# Patient Record
Sex: Female | Born: 1945
Health system: Southern US, Community
[De-identification: ages and names within clinical notes are randomized; demographics above are authoritative.]

## PROBLEM LIST (undated history)

## (undated) DIAGNOSIS — R05 Cough: Secondary | ICD-10-CM

## (undated) DIAGNOSIS — I10 Essential (primary) hypertension: Secondary | ICD-10-CM

## (undated) DIAGNOSIS — T7840XA Allergy, unspecified, initial encounter: Secondary | ICD-10-CM

## (undated) DIAGNOSIS — I214 Non-ST elevation (NSTEMI) myocardial infarction: Secondary | ICD-10-CM

## (undated) DIAGNOSIS — K259 Gastric ulcer, unspecified as acute or chronic, without hemorrhage or perforation: Secondary | ICD-10-CM

## (undated) DIAGNOSIS — Z720 Tobacco use: Secondary | ICD-10-CM

## (undated) DIAGNOSIS — G8929 Other chronic pain: Secondary | ICD-10-CM

## (undated) DIAGNOSIS — R058 Other specified cough: Secondary | ICD-10-CM

## (undated) HISTORY — PX: TUBAL LIGATION: SHX77

## (undated) HISTORY — PX: NASAL SEPTUM SURGERY: SHX37

## (undated) HISTORY — PX: ABDOMINAL HYSTERECTOMY: SHX81

## (undated) HISTORY — DX: Allergy, unspecified, initial encounter: T78.40XA

## (undated) HISTORY — PX: ANKLE FRACTURE SURGERY: SHX122

## (undated) HISTORY — PX: TONSILLECTOMY: SUR1361

---

## 2002-05-17 ENCOUNTER — Ambulatory Visit (HOSPITAL_BASED_OUTPATIENT_CLINIC_OR_DEPARTMENT_OTHER): Admission: RE | Admit: 2002-05-17 | Discharge: 2002-05-17 | Payer: Self-pay | Admitting: Orthopedic Surgery

## 2004-08-18 DIAGNOSIS — K259 Gastric ulcer, unspecified as acute or chronic, without hemorrhage or perforation: Secondary | ICD-10-CM

## 2004-08-18 HISTORY — PX: REPAIR OF PERFORATED ULCER: SHX6065

## 2004-08-18 HISTORY — DX: Gastric ulcer, unspecified as acute or chronic, without hemorrhage or perforation: K25.9

## 2005-04-22 ENCOUNTER — Ambulatory Visit (HOSPITAL_COMMUNITY): Admission: RE | Admit: 2005-04-22 | Discharge: 2005-04-23 | Payer: Self-pay | Admitting: Orthopedic Surgery

## 2006-08-18 HISTORY — PX: BACK SURGERY: SHX140

## 2016-04-22 ENCOUNTER — Ambulatory Visit (INDEPENDENT_AMBULATORY_CARE_PROVIDER_SITE_OTHER): Payer: Medicare HMO

## 2016-04-22 ENCOUNTER — Ambulatory Visit (INDEPENDENT_AMBULATORY_CARE_PROVIDER_SITE_OTHER): Payer: Medicare HMO | Admitting: Family Medicine

## 2016-04-22 VITALS — BP 118/80 | HR 102 | Temp 98.4°F | Resp 16 | Ht 62.75 in | Wt 190.8 lb

## 2016-04-22 DIAGNOSIS — R140 Abdominal distension (gaseous): Secondary | ICD-10-CM | POA: Diagnosis not present

## 2016-04-22 DIAGNOSIS — Z131 Encounter for screening for diabetes mellitus: Secondary | ICD-10-CM

## 2016-04-22 DIAGNOSIS — G8929 Other chronic pain: Secondary | ICD-10-CM

## 2016-04-22 DIAGNOSIS — Z23 Encounter for immunization: Secondary | ICD-10-CM

## 2016-04-22 DIAGNOSIS — D72829 Elevated white blood cell count, unspecified: Secondary | ICD-10-CM

## 2016-04-22 DIAGNOSIS — Z1321 Encounter for screening for nutritional disorder: Secondary | ICD-10-CM | POA: Diagnosis not present

## 2016-04-22 DIAGNOSIS — R1013 Epigastric pain: Secondary | ICD-10-CM

## 2016-04-22 DIAGNOSIS — Z1329 Encounter for screening for other suspected endocrine disorder: Secondary | ICD-10-CM | POA: Diagnosis not present

## 2016-04-22 DIAGNOSIS — I10 Essential (primary) hypertension: Secondary | ICD-10-CM | POA: Diagnosis not present

## 2016-04-22 DIAGNOSIS — G4700 Insomnia, unspecified: Secondary | ICD-10-CM

## 2016-04-22 LAB — CBC WITH DIFFERENTIAL/PLATELET
BASOS ABS: 0 {cells}/uL (ref 0–200)
Basophils Relative: 0 %
EOS ABS: 124 {cells}/uL (ref 15–500)
Eosinophils Relative: 1 %
HEMATOCRIT: 43.8 % (ref 35.0–45.0)
Hemoglobin: 14.7 g/dL (ref 11.7–15.5)
Lymphocytes Relative: 29 %
Lymphs Abs: 3596 cells/uL (ref 850–3900)
MCH: 28.1 pg (ref 27.0–33.0)
MCHC: 33.6 g/dL (ref 32.0–36.0)
MCV: 83.7 fL (ref 80.0–100.0)
MONOS PCT: 7 %
MPV: 11 fL (ref 7.5–12.5)
Monocytes Absolute: 868 cells/uL (ref 200–950)
NEUTROS ABS: 7812 {cells}/uL — AB (ref 1500–7800)
NEUTROS PCT: 63 %
Platelets: 337 10*3/uL (ref 140–400)
RBC: 5.23 MIL/uL — ABNORMAL HIGH (ref 3.80–5.10)
RDW: 15.9 % — AB (ref 11.0–15.0)
WBC: 12.4 10*3/uL — ABNORMAL HIGH (ref 3.8–10.8)

## 2016-04-22 MED ORDER — PREGABALIN 150 MG PO CAPS: 150.0000 mg | ORAL_CAPSULE | Freq: Two times a day (BID) | ORAL | 0 refills | Status: DC

## 2016-04-22 MED ORDER — LISINOPRIL-HYDROCHLOROTHIAZIDE 10-12.5 MG PO TABS: 2.0000 | ORAL_TABLET | Freq: Every day | ORAL | 3 refills | Status: DC

## 2016-04-22 MED ORDER — ZOLPIDEM TARTRATE 10 MG PO TABS: 10.0000 mg | ORAL_TABLET | Freq: Every evening | ORAL | 1 refills | Status: DC | PRN

## 2016-04-22 MED ORDER — OMEPRAZOLE 40 MG PO CPDR: 40.0000 mg | DELAYED_RELEASE_CAPSULE | Freq: Every day | ORAL | 3 refills | Status: DC

## 2016-04-22 NOTE — Patient Instructions (Addendum)
Gastroenterology will contact you to schedule an appointment for you.  I have increased your omeprazole 40 mg daily for acid reflux.  If pain worsens or does not resolve by Saturday, return to the office for further evaluation and possible further diagnostic imaging.   IF you received an x-ray today, you will receive an invoice from [facility]. Please contact [facility] at [phone] with questions or concerns regarding your invoice.   IF you received labwork today, you will receive an invoice from [employer]. Please contact [facility] at [phone] with questions or concerns regarding your invoice.   Our billing staff will not be able to assist you with questions regarding bills from these companies.  You will be contacted with the lab results as soon as they are available. The fastest way to get your results is to activate your My Chart account. Instructions are located on the last page of this paperwork. If you have not heard from [place] regarding the results in 2 weeks, please contact this office.

## 2016-04-22 NOTE — Progress Notes (Signed)
Patient ID: Bonnie Lawson, female    DOB: 25-May-1946, 70 y.o.   MRN: [id number]  PCP: Molli Barrows, FNP  Chief Complaint  Patient presents with  . Abdominal Pain    x 2 weeks    Subjective:   HPI Presents for evaluation of abdominal pain times 2 weeks.  70 year old female presents with a complaint of abdominal pain 2 weeks. She has a past medical history of multiple abdominal surgeries including a perforated ulcer. She reports that the pain she's been having for the last 2 weeks is similar to that of when she had the perforated ulcer. She also reports an increased sensation of bloating, burping, and gas. She denies fever, nausea, vomiting, or diarrhea.   She reports no dark and tarry stools. Reports her normal number of stools daily which averages about 2-3. She reports to consistency of her stools as being loose but not diarrhea to soft area.  She recently relocated to the area and thinks she has had a colonoscopy within last 10 years but is uncertain. She takes omeprazole 20 mg for acid reflux. She is also requesting medication refills today as she will be going out of town within the next couple weeks and is afraid that she will run out of medications.  . Social History   Social History  . Marital status: Divorced    Spouse name: N/A  . Number of children: N/A  . Years of education: N/A   Occupational History  . Not on file.   Social History Main Topics  . Smoking status: Current Some Day Smoker  . Smokeless tobacco: Current User  . Alcohol use Not on file  . Drug use: Unknown  . Sexual activity: Not on file   Other Topics Concern  . Not on file   Social History Narrative  . No narrative on file    .No family history on file.   Review of Systems  Constitutional: Negative.   Respiratory: Negative.   Cardiovascular: Negative.   Gastrointestinal: Positive for abdominal pain.       See history of present illness   There are no active problems to display  for this patient.    Prior to Admission medications   Medication Sig Start Date End Date Taking? Authorizing Provider  aspirin EC 81 MG tablet Take 81 mg by mouth daily.   Yes Historical Provider, MD  levothyroxine (SYNTHROID, LEVOTHROID) 75 MCG tablet Take 75 mcg by mouth daily before breakfast.   Yes Historical Provider, MD  lisinopril-hydrochlorothiazide (PRINZIDE,ZESTORETIC) 20-25 MG tablet Take 1 tablet by mouth daily.   Yes Historical Provider, MD  omeprazole (PRILOSEC) 20 MG capsule Take 20 mg by mouth daily.   Yes Historical Provider, MD  pregabalin (LYRICA) 150 MG capsule Take 150 mg by mouth 2 (two) times daily.   Yes Historical Provider, MD  venlafaxine (EFFEXOR) 75 MG tablet Take 75 mg by mouth every morning.   Yes Historical Provider, MD  amitriptyline (ELAVIL) 50 MG tablet Take 50 mg by mouth at bedtime.    Historical Provider, MD  HYDROcodone-acetaminophen (NORCO/VICODIN) 5-325 MG tablet Take 1 tablet by mouth every 6 (six) hours as needed for moderate pain.    Historical Provider, MD  zolpidem (AMBIEN) 10 MG tablet Take by mouth at bedtime as needed for sleep.    Historical Provider, MD    Allergies  Allergen Reactions  . Sulfa Antibiotics        Objective:  Physical Exam  Constitutional: She is oriented to  person, place, and time. She appears well-developed and well-nourished.  Abdominal: Soft. Bowel sounds are normal. She exhibits no distension and no mass. There is no tenderness. There is no rebound and no guarding.  Scarring from healed surgical issue at mid lower epigastric region descending down into the lower pelvis area.   Musculoskeletal: Normal range of motion.  Neurological: She is alert and oriented to person, place, and time.  Skin: Skin is warm and dry.     . Vitals:   04/22/16 1526  BP: 118/80  Pulse: (!) 102  Resp: 16  Temp: 98.4 F (36.9 C)   Assessment & Plan:  1. Abdominal pain, chronic, epigastric, unknown etiology.  Patient has a  significant history of abdominal complaints. She reports he spent an extended period of time since she has seen a gastroenterologist.  - DG Abd 2 Views; negative of acute findings. - CBC with Differential/Platelet-Leukocytosis.  - CT Abdomen Pelvis W Contrast-Negative for acute findings  Plan: -Omeprazole 40 mg daily, for bloating and acid reflux symptoms. - Ambulatory referral to Gastroenterology  2. Abdominal bloating - Ambulatory referral to Gastroenterology -Plan: -Omeprazole 40 mg daily, for bloating and acid reflux symptoms.  3. Influenza vaccine administered - Flu Vaccine QUAD 36+ mos IM  4. Screening for diabetes mellitus (DM) - COMPLETE METABOLIC PANEL WITH GFR; Future  5. Screening for thyroid disorder - TSH; Future  6. Encounter for vitamin deficiency screening - VITAMIN D 25 Hydroxy (Vit-D Deficiency, Fractures); Future  7. Leukocytosis - CT Abdomen Pelvis W Contrast-CT of abdomen and pelvis to rule out diverticulitis or bowel perforation. - CBC with Differential/Platelet; repeat to re-evaluate elevated WBC.  8. Insomnia, reports long-term use of zolpidem for sleep. Plan: -Increase daily physical activity during the day to promote sleep. -zolpidem (AMBIEN) 10 MG tablet, as needed, hours of sleep.  9. Essential hypertension, controlled. Plan: Continue lisinopril-hydrochlorothiazide (PRINZIDE,ZESTORETIC) 10-12.5 MG daily.  10. Chronic pain, patient reports a long time use of Lyrica chronic pain.  Agree to refill one time only until she has received a complete physical to evaluate the necessity for continued use of this medication.   Patient will return in 5 days for complete physical exam. Placed orders for future fasting labs.  Carroll Sage. Kenton Kingfisher, MSN, FNP-C [facility]

## 2016-04-23 ENCOUNTER — Telehealth: Payer: Self-pay | Admitting: Emergency Medicine

## 2016-04-23 ENCOUNTER — Ambulatory Visit (HOSPITAL_COMMUNITY)
Admission: RE | Admit: 2016-04-23 | Discharge: 2016-04-23 | Disposition: A | Discharge status: Home / Self Care | Payer: Medicare HMO | Source: Ambulatory Visit | Attending: Family Medicine | Admitting: Family Medicine

## 2016-04-23 DIAGNOSIS — R1013 Epigastric pain: Secondary | ICD-10-CM | POA: Insufficient documentation

## 2016-04-23 DIAGNOSIS — K760 Fatty (change of) liver, not elsewhere classified: Secondary | ICD-10-CM | POA: Diagnosis not present

## 2016-04-23 DIAGNOSIS — G8929 Other chronic pain: Secondary | ICD-10-CM

## 2016-04-23 DIAGNOSIS — D72829 Elevated white blood cell count, unspecified: Secondary | ICD-10-CM | POA: Diagnosis not present

## 2016-04-23 LAB — COMPREHENSIVE METABOLIC PANEL
ALBUMIN: 4.1 g/dL (ref 3.5–5.0)
ALK PHOS: 103 U/L (ref 38–126)
ALT: 16 U/L (ref 14–54)
AST: 21 U/L (ref 15–41)
Anion gap: 11 (ref 5–15)
BUN: 16 mg/dL (ref 6–20)
CHLORIDE: 97 mmol/L — AB (ref 101–111)
CO2: 27 mmol/L (ref 22–32)
CREATININE: 0.91 mg/dL (ref 0.44–1.00)
Calcium: 10.5 mg/dL — ABNORMAL HIGH (ref 8.9–10.3)
GFR calc non Af Amer: >60 mL/min (ref >60)
GLUCOSE: 114 mg/dL — AB (ref 65–99)
Potassium: 4.1 mmol/L (ref 3.5–5.1)
SODIUM: 135 mmol/L (ref 135–145)
Total Bilirubin: 0.7 mg/dL (ref 0.3–1.2)
Total Protein: 7.3 g/dL (ref 6.5–8.1)

## 2016-04-23 MED ORDER — IOPAMIDOL (ISOVUE-300) INJECTION 61%
100.0000 mL | Freq: Once | INTRAVENOUS | Status: AC | PRN
  Administered 2016-04-23: 100 mL via INTRAVENOUS

## 2016-04-23 MED ORDER — IOPAMIDOL (ISOVUE-300) INJECTION 61%
INTRAVENOUS | Status: DC
Start: 2016-04-23 — End: 2016-04-24
  Filled 2016-04-23: qty 100

## 2016-04-23 MED ORDER — BARIUM SULFATE 2.1 % PO SUSP
ORAL | Status: AC
  Filled 2016-04-23: qty 2

## 2016-04-25 ENCOUNTER — Telehealth: Payer: Self-pay | Admitting: Family Medicine

## 2016-04-25 DIAGNOSIS — G8929 Other chronic pain: Secondary | ICD-10-CM | POA: Insufficient documentation

## 2016-04-25 DIAGNOSIS — I10 Essential (primary) hypertension: Secondary | ICD-10-CM | POA: Insufficient documentation

## 2016-04-25 DIAGNOSIS — R1013 Epigastric pain: Principal | ICD-10-CM

## 2016-04-25 DIAGNOSIS — G4700 Insomnia, unspecified: Secondary | ICD-10-CM | POA: Insufficient documentation

## 2016-04-25 DIAGNOSIS — Z23 Encounter for immunization: Secondary | ICD-10-CM | POA: Insufficient documentation

## 2016-04-25 DIAGNOSIS — R140 Abdominal distension (gaseous): Secondary | ICD-10-CM | POA: Insufficient documentation

## 2016-04-25 DIAGNOSIS — Z131 Encounter for screening for diabetes mellitus: Secondary | ICD-10-CM | POA: Insufficient documentation

## 2016-04-25 DIAGNOSIS — D72829 Elevated white blood cell count, unspecified: Secondary | ICD-10-CM | POA: Insufficient documentation

## 2016-04-25 NOTE — Telephone Encounter (Signed)
Spoke with patient on the phone while at [facility] Radiology to advise CT results were normal. Asked to return to the office on Saturday to repeat CBC to reevaluate WBC.   Bonnie Lawson. Kenton Kingfisher, MSN, FNP-C [facility]

## 2016-04-26 ENCOUNTER — Ambulatory Visit (INDEPENDENT_AMBULATORY_CARE_PROVIDER_SITE_OTHER): Payer: Medicare HMO | Admitting: Family Medicine

## 2016-04-26 VITALS — BP 130/78 | HR 92 | Temp 97.3°F | Resp 16 | Ht 63.0 in | Wt 190.2 lb

## 2016-04-26 DIAGNOSIS — G4700 Insomnia, unspecified: Secondary | ICD-10-CM | POA: Diagnosis not present

## 2016-04-26 DIAGNOSIS — G8929 Other chronic pain: Secondary | ICD-10-CM

## 2016-04-26 DIAGNOSIS — Z23 Encounter for immunization: Secondary | ICD-10-CM

## 2016-04-26 DIAGNOSIS — D72829 Elevated white blood cell count, unspecified: Secondary | ICD-10-CM

## 2016-04-26 DIAGNOSIS — Z1321 Encounter for screening for nutritional disorder: Secondary | ICD-10-CM

## 2016-04-26 DIAGNOSIS — F411 Generalized anxiety disorder: Secondary | ICD-10-CM

## 2016-04-26 DIAGNOSIS — G5793 Unspecified mononeuropathy of bilateral lower limbs: Secondary | ICD-10-CM | POA: Diagnosis not present

## 2016-04-26 DIAGNOSIS — Z1329 Encounter for screening for other suspected endocrine disorder: Secondary | ICD-10-CM | POA: Diagnosis not present

## 2016-04-26 DIAGNOSIS — Z131 Encounter for screening for diabetes mellitus: Secondary | ICD-10-CM | POA: Diagnosis not present

## 2016-04-26 DIAGNOSIS — I10 Essential (primary) hypertension: Secondary | ICD-10-CM

## 2016-04-26 LAB — LIPID PANEL
CHOLESTEROL: 203 mg/dL — AB (ref 125–200)
HDL: 56 mg/dL (ref >=46)
LDL Cholesterol: 118 mg/dL (ref <130)
Total CHOL/HDL Ratio: 3.6 Ratio (ref <=5.0)
Triglycerides: 144 mg/dL (ref <150)
VLDL: 29 mg/dL (ref <30)

## 2016-04-26 LAB — CBC WITH DIFFERENTIAL/PLATELET
BASOS PCT: 0 %
Basophils Absolute: 0 cells/uL (ref 0–200)
EOS ABS: 158 {cells}/uL (ref 15–500)
Eosinophils Relative: 2 %
HEMATOCRIT: 43.5 % (ref 35.0–45.0)
HEMOGLOBIN: 14.6 g/dL (ref 11.7–15.5)
LYMPHS PCT: 23 %
Lymphs Abs: 1817 cells/uL (ref 850–3900)
MCH: 28.5 pg (ref 27.0–33.0)
MCHC: 33.6 g/dL (ref 32.0–36.0)
MCV: 84.8 fL (ref 80.0–100.0)
MONO ABS: 632 {cells}/uL (ref 200–950)
MPV: 10.9 fL (ref 7.5–12.5)
Monocytes Relative: 8 %
Neutro Abs: 5293 cells/uL (ref 1500–7800)
Neutrophils Relative %: 67 %
Platelets: 295 10*3/uL (ref 140–400)
RBC: 5.13 MIL/uL — ABNORMAL HIGH (ref 3.80–5.10)
RDW: 15.3 % — AB (ref 11.0–15.0)
WBC: 7.9 10*3/uL (ref 3.8–10.8)

## 2016-04-26 LAB — COMPLETE METABOLIC PANEL WITH GFR
ALBUMIN: 4.3 g/dL (ref 3.6–5.1)
ALK PHOS: 98 U/L (ref 33–130)
ALT: 13 U/L (ref 6–29)
AST: 17 U/L (ref 10–35)
BILIRUBIN TOTAL: 0.5 mg/dL (ref 0.2–1.2)
BUN: 17 mg/dL (ref 7–25)
CO2: 28 mmol/L (ref 20–31)
Calcium: 9.5 mg/dL (ref 8.6–10.4)
Chloride: 101 mmol/L (ref 98–110)
Creat: 0.9 mg/dL (ref 0.60–0.93)
GFR, Est African American: 75 mL/min (ref >=60)
GFR, Est Non African American: 65 mL/min (ref >=60)
GLUCOSE: 101 mg/dL — AB (ref 65–99)
Potassium: 4.3 mmol/L (ref 3.5–5.3)
SODIUM: 134 mmol/L — AB (ref 135–146)
TOTAL PROTEIN: 7.3 g/dL (ref 6.1–8.1)

## 2016-04-26 LAB — TSH: TSH: 2.39 mIU/L

## 2016-04-26 MED ORDER — ZOLPIDEM TARTRATE 10 MG PO TABS: 10.0000 mg | ORAL_TABLET | Freq: Every evening | ORAL | 0 refills | Status: DC | PRN

## 2016-04-26 MED ORDER — CYCLOBENZAPRINE HCL 10 MG PO TABS: 10.0000 mg | ORAL_TABLET | Freq: Three times a day (TID) | ORAL | 0 refills | Status: DC | PRN

## 2016-04-26 MED ORDER — VENLAFAXINE HCL ER 75 MG PO CP24: 75.0000 mg | ORAL_CAPSULE | Freq: Every day | ORAL | 1 refills | Status: DC

## 2016-04-26 MED ORDER — TRAMADOL HCL 50 MG PO TABS: 50.0000 mg | ORAL_TABLET | Freq: Three times a day (TID) | ORAL | 0 refills | Status: DC | PRN

## 2016-04-26 MED ORDER — PREGABALIN 150 MG PO CAPS: 150.0000 mg | ORAL_CAPSULE | Freq: Two times a day (BID) | ORAL | 0 refills | Status: DC

## 2016-04-26 MED ORDER — ZOLPIDEM TARTRATE 10 MG PO TABS
10.0000 mg | ORAL_TABLET | Freq: Every evening | ORAL | 0 refills | Status: DC | PRN
Start: 2016-05-08 — End: 2016-04-26

## 2016-04-26 MED ORDER — CYCLOBENZAPRINE HCL 10 MG PO TABS: 10.0000 mg | ORAL_TABLET | Freq: Three times a day (TID) | ORAL | 2 refills | Status: DC | PRN

## 2016-04-26 NOTE — Patient Instructions (Addendum)
Follow-up for medication refills. Increase activity for weight loss.     Pneumococcal Vaccine, Polyvalent suspension for injection What is this medicine? PNEUMOCOCCAL VACCINE (NEU mo KOK al vak SEEN) is a vaccine used to prevent pneumococcus bacterial infections. These bacteria can cause serious infections like pneumonia, meningitis, and blood infections. This vaccine will lower your chance of getting pneumonia. If you do get pneumonia, it can make your symptoms milder and your illness shorter. This vaccine will not treat an infection and will not cause infection. This vaccine is recommended for infants and young children, adults with certain medical conditions, and adults 11 years or older. This medicine may be used for other purposes; ask your health care provider or pharmacist if you have questions. What should I tell my health care provider before I take this medicine? They need to know if you have any of these conditions: -bleeding problems -fever -immune system problems -an unusual or allergic reaction to pneumococcal vaccine, diphtheria toxoid, other vaccines, latex, other medicines, foods, dyes, or preservatives -pregnant or trying to get pregnant -breast-feeding How should I use this medicine? This vaccine is for injection into a muscle. It is given by a health care professional. A copy of Vaccine Information Statements will be given before each vaccination. Read this sheet carefully each time. The sheet may change frequently. Talk to your pediatrician regarding the use of this medicine in children. While this drug may be prescribed for children as young as 75 weeks old for selected conditions, precautions do apply. Overdosage: If you think you have taken too much of this medicine contact a poison control center or emergency room at once. NOTE: This medicine is only for you. Do not share this medicine with others. What if I miss a dose? It is important not to miss your dose. Call  your doctor or health care professional if you are unable to keep an appointment. What may interact with this medicine? -medicines for cancer chemotherapy -medicines that suppress your immune function -steroid medicines like prednisone or cortisone This list may not describe all possible interactions. Give your health care provider a list of all the medicines, herbs, non-prescription drugs, or dietary supplements you use. Also tell them if you smoke, drink alcohol, or use illegal drugs. Some items may interact with your medicine. What should I watch for while using this medicine? Mild fever and pain should go away in 3 days or less. Report any unusual symptoms to your doctor or health care professional. What side effects may I notice from receiving this medicine? Side effects that you should report to your doctor or health care professional as soon as possible: -allergic reactions like skin rash, itching or hives, swelling of the face, lips, or tongue -breathing problems -confused -fast or irregular heartbeat -fever over 102 degrees F -seizures -unusual bleeding or bruising -unusual muscle weakness Side effects that usually do not require medical attention (report to your doctor or health care professional if they continue or are bothersome): -aches and pains -diarrhea -fever of 102 degrees F or less -headache -irritable -loss of appetite -pain, tender at site where injected -trouble sleeping This list may not describe all possible side effects. Call your doctor for medical advice about side effects. You may report side effects to FDA at 1-800-FDA-1088. Where should I keep my medicine? This does not apply. This vaccine is given in a clinic, pharmacy, doctor's office, or other health care setting and will not be stored at home. NOTE: This sheet is a summary.  It may not cover all possible information. If you have questions about this medicine, talk to your doctor, pharmacist, or health  care provider.    2016, Elsevier/Gold Standard. (2014-05-11 10:27:27)    IF you received an x-ray today, you will receive an invoice from [facility] Rad[facility]. Please contact [facility]iology at [phone] with questions or concerns regarding your invoice.   IF you received labwork today, you will receive an invoice from [employer]. Please contact [facility] at [phone] with questions or concerns regarding your invoice.   Our billing staff will not be able to assist you with questions regarding bills from these companies.  You will be contacted with the lab results as soon as they are available. The fastest way to get your results is to activate your My Chart account. Instructions are located on the last page of this paperwork. If you have not heard from [place] regarding the results in 2 weeks, please contact this office.     h Exercising to Stay Healthy Exercising regularly is important. It has many health benefits, such as:  Improving your overall fitness, flexibility, and endurance.  Increasing your bone density.  Helping with weight control.  Decreasing your body fat.  Increasing your muscle strength.  Reducing stress and tension.  Improving your overall health. In order to become healthy and stay healthy, it is recommended that you do moderate-intensity and vigorous-intensity exercise. You can tell that you are exercising at a moderate intensity if you have a higher heart rate and faster breathing, but you are still able to hold a conversation. You can tell that you are exercising at a vigorous intensity if you are breathing much harder and faster and cannot hold a conversation while exercising. HOW OFTEN SHOULD I EXERCISE? Choose an activity that you enjoy and set realistic goals. Your health care provider can help you to make an activity plan that works for you. Exercise regularly as directed by your health care provider. This may include:    Doing resistance training twice each week, such as:  Push-ups.  Sit-ups.  Lifting weights.  Using resistance bands.  Doing a given intensity of exercise for a given amount of time. Choose from these options:  150 minutes of moderate-intensity exercise every week.  75 minutes of vigorous-intensity exercise every week.  A mix of moderate-intensity and vigorous-intensity exercise every week. Children, pregnant women, people who are out of shape, people who are overweight, and older adults may need to consult a health care provider for individual recommendations. If you have any sort of medical condition, be sure to consult your health care provider before starting a new exercise program.  WHAT ARE SOME EXERCISE IDEAS? Some moderate-intensity exercise ideas include:   Walking at a rate of 1 mile in 15 minutes.  Biking.  Hiking.  Golfing.  Dancing. Some vigorous-intensity exercise ideas include:   Walking at a rate of at least 4.5 miles per hour.  Jogging or running at a rate of 5 miles per hour.  Biking at a rate of at least 10 miles per hour.  Lap swimming.  Roller-skating or in-line skating.  Cross-country skiing.  Vigorous competitive sports, such as football, basketball, and soccer.  Jumping rope.  Aerobic dancing. WHAT ARE SOME EVERYDAY ACTIVITIES THAT CAN HELP ME TO GET EXERCISE?  Yard work, such as:  Psychologist, educational.  Raking and bagging leaves.  Washing and waxing your car.  Pushing a stroller.  Shoveling snow.  Gardening.  Washing windows  or floors. HOW CAN I BE MORE ACTIVE IN MY DAY-TO-DAY ACTIVITIES?  Use the stairs instead of the elevator.  Take a walk during your lunch break.  If you drive, park your car farther away from work or school.  If you take public transportation, get off one stop early and walk the rest of the way.  Make all of your phone calls while standing up and walking around.  Get up, stretch, and walk  around every 30 minutes throughout the day. WHAT GUIDELINES SHOULD I FOLLOW WHILE EXERCISING?  Do not exercise so much that you hurt yourself, feel dizzy, or get very short of breath.  Consult your health care provider before starting a new exercise program.  Wear comfortable clothes and shoes with good support.  Drink plenty of water while you exercise to prevent dehydration or heat stroke. Body water is lost during exercise and must be replaced.  Work out until you breathe faster and your heart beats faster.   This information is not intended to replace advice given to you by your health care provider. Make sure you discuss any questions you have with your health care provider.   Document Released: 09/06/2010 Document Revised: 08/25/2014 Document Reviewed: 01/05/2014 Elsevier Interactive Patient Education [employer].

## 2016-04-26 NOTE — Progress Notes (Signed)
Patient ID: Bonnie Lawson, female    DOB: [birth year], 70 y.o.   MRN: [id number]  PCP: Molli Barrows, FNP  Chief Complaint  Patient presents with  . Follow-up    labs    Subjective:   HPI 70 year old female presents for a medication refill and to establish care.   She was previously followed at [facility] in [facility] more than 1 year ago.  Wished to establish care here at [facility].  She was seen earlier this week for a 2 week history of abdominal pain , which she now reports has resolved after being prescribed a higher dose of omeprazole.   In review of her history, she reports a bilateral foot neuropathy, worst in left foot following a broken ankle in which she is treated with Lyrica and amitriptyline for the chronic pain. Denies any recent falls, or numbness or tingling extending to her fingers.   She requests screening for diabetes as her mother (living) is insulin dependent, sister and her daughter are both diabetics. She reports that it has been awhile since she has had her thyroid level and cholesterol checked. Patient reports chronically taking Zolpidem for sleep as she has struggled with insomnia for several years.  She reports having back surgery in 2006 which has contributed to her chronic pain.  She is currently taking hydrocodone-acetaminophen for pain management with some relief. She is taking effexor for generalized anxiety disorder.  Denies any recent panic attacks or agitation. Reports good relief and control of symptoms with current medication.   Patient reports having her colonoscopy within the last 10 years, is due for her mammogram, and request the Prevnar 13 already received Pneumovax 23.  . Social History   Social History  . Marital status: Divorced    Spouse name: N/A  . Number of children: N/A  . Years of education: N/A   Occupational History  . Not on file.   Social History Main Topics  . Smoking status: Current Some Day Smoker  . Smokeless  tobacco: Current User  . Alcohol use Not on file  . Drug use: Unknown  . Sexual activity: Not on file   Other Topics Concern  . Not on file   Social History Narrative  . No narrative on file  .No family history on file.    Review of Systems SEE HPI     . Depression screen [facility] 2/9 04/26/2016 04/22/2016  Decreased Interest 0 0  Down, Depressed, Hopeless 0 1  PHQ - 2 Score 0 1    Patient Active Problem List   Diagnosis Date Noted  . Abdominal pain, chronic, epigastric 04/25/2016  . Abdominal bloating 04/25/2016  . Leukocytosis 04/25/2016  . Insomnia 04/25/2016  . Essential hypertension 04/25/2016  . Chronic pain 04/25/2016     Prior to Admission medications   Medication Sig Start Date End Date Taking? Authorizing Provider  amitriptyline (ELAVIL) 50 MG tablet Take 50 mg by mouth at bedtime.   Yes Historical Provider, MD  aspirin EC 81 MG tablet Take 81 mg by mouth daily.   Yes Historical Provider, MD  HYDROcodone-acetaminophen (NORCO/VICODIN) 5-325 MG tablet Take 1 tablet by mouth every 6 (six) hours as needed for moderate pain.   Yes Historical Provider, MD  levothyroxine (SYNTHROID, LEVOTHROID) 75 MCG tablet Take 75 mcg by mouth daily before breakfast.   Yes Historical Provider, MD  lisinopril-hydrochlorothiazide (PRINZIDE,ZESTORETIC) 10-12.5 MG tablet Take 2 tablets by mouth daily. 04/22/16  Yes Sedalia Muta, FNP  omeprazole (  PRILOSEC) 40 MG capsule Take 1 capsule (40 mg total) by mouth daily. 04/22/16  Yes Sedalia Muta, FNP  pregabalin (LYRICA) 150 MG capsule Take 1 capsule (150 mg total) by mouth 2 (two) times daily. 04/22/16  Yes Sedalia Muta, FNP  venlafaxine (EFFEXOR) 75 MG tablet Take 75 mg by mouth every morning.   Yes Historical Provider, MD  zolpidem (AMBIEN) 10 MG tablet Take 1 tablet (10 mg total) by mouth at bedtime as needed for sleep. 04/22/16 05/22/16 Yes Sedalia Muta, FNP     Allergies  Allergen Reactions  .  Sulfa Antibiotics        Objective:  Physical Exam  Constitutional: She appears well-developed and well-nourished.  HENT:  Head: Normocephalic and atraumatic.  Right Ear: External ear normal.  Left Ear: External ear normal.  Nose: Nose normal.  Mouth/Throat: Oropharynx is clear and moist.  Eyes: Conjunctivae and EOM are normal. Pupils are equal, round, and reactive to light.  Neck: Normal range of motion. Neck supple. No thyromegaly present.  Cardiovascular: Normal rate, regular rhythm, normal heart sounds and intact distal pulses.   Pulmonary/Chest: Effort normal and breath sounds normal.  Abdominal: Soft. Bowel sounds are normal. She exhibits no distension and no mass. There is no tenderness. There is no rebound and no guarding.  Musculoskeletal: Normal range of motion.  Neurological: She is alert. She has normal reflexes.  Skin: Skin is warm and dry.  Seborrheic keratosis upper extremities, neck, and face.  Psychiatric: She has a normal mood and affect. Her behavior is normal. Judgment and thought content normal.   . Vitals:   04/26/16 0928  BP: 130/78  Pulse: (!) 113  Resp: 16  Temp: 97.3 F (36.3 C)   Assessment & Plan:  1. Essential hypertension, Controlled  Plan: - EKG 12-Lead, to obtain a baseline rhythm for patient EMR Continue Lisinopril-hydrochlorothiazide 10-12.5 mg tablet   2. Leukocytosis - CBC with Differential/Platelet -Resolved.  3. Neuropathy of both feet ([place]), Stable  Plan: Continue pregabalin (LYRICA) 150 MG capsule, twice daily.       Sig: Take 1 tablet (50 mg total) by mouth every 8 (eight) hours as needed.    Dispense:  30 tablet    Refill:  0    Order Specific Question:   Supervising Provider    Answer:   SHAW, EVA N [[id number]]  . cyclobenzaprine (FLEXERIL) 10 MG tablet    Sig: Take 1 tablet (10 mg total) by mouth 3 (three) times daily as needed for muscle spasms.    Dispense:  30 tablet    Refill:  2    Order Specific Question:    Supervising Provider    Answer:   SHAW, EVA N [[id number]]    4. Generalized anxiety disorder, controlled/stable. Plan: Continue venlafaxine XR (EFFEXOR XR) 75 MG 24 hr capsule, once daily  5. Insomnia, controlled. Plan: Continue-zolpidem (AMBIEN) 10 MG tablet, as needed for sleep.  6. Chronic pain, uncontrolled with current pain medication.  Patient is already chronically prescribed controlled medications for neuropathy and insomnia and she reported minimal relief of pain with hydrocodone-acetaminophen. Plan:  Discontinue patients hydrocodone-acetaminophen and replace with  Tramadol 50 mg every 8 hours as needed for back pain. Cyclobenzaprine 10 mg up to 3 times daily as needed for back muscle spasms.  7. Screening for diabetes mellitus (DM), current lab results were mostly unremarkable. Plan: - Lipid panel-increase physical activity. Reduce intake for food high in saturated fat. Weight loss. - COMPLETE METABOLIC PANEL  WITH GFR-no evidence of diabetes.   Recheck in 6-12  months.  8. Hypothyroidism, stable Plan: - TSH-normal  Continue levothyroxine 75 mcg daily.  Recheck TSH in 12 months  9. Encounter for vitamin deficiency screening Plan: - VITAMIN D 25 Hydroxy (Vit-D Deficiency, Fractures)-normal  -Reheck at next wellness visit in 12 months.   Patient signed and initialed a controlled substance agreement form. Sent to be scanned int EMR. Follow-up in 3 months for medication refills.  Carroll Sage. Kenton Kingfisher, MSN, FNP-C [facility]

## 2016-04-28 LAB — VITAMIN D 25 HYDROXY (VIT D DEFICIENCY, FRACTURES): Vit D, 25-Hydroxy: 63 ng/mL (ref 30–100)

## 2016-04-28 MED ORDER — LEVOTHYROXINE SODIUM 75 MCG PO TABS: 75.0000 ug | ORAL_TABLET | Freq: Every day | ORAL | 3 refills | Status: DC

## 2016-04-30 ENCOUNTER — Encounter: Payer: Self-pay | Admitting: Family Medicine

## 2016-04-30 NOTE — Progress Notes (Signed)
Dear Ms. [name],  Below are the results from your recent visit as were as expected:  Resulted Orders  Lipid panel  Result Value Ref Range   Cholesterol 203 (H) 125 - 200 mg/dL   Triglycerides 144 <150 mg/dL   HDL 56 >=46 mg/dL   Total CHOL/HDL Ratio 3.6 <=5.0 Ratio   VLDL 29 <30 mg/dL   LDL Cholesterol 118 <130 mg/dL     Comment:       Total Cholesterol/HDL Ratio:CHD Risk                        Coronary Heart Disease Risk Table                                        Men       Women          1/2 Average Risk              3.4        3.3              Average Risk              5.0        4.4           2X Average Risk              9.6        7.1           3X Average Risk             23.4       11.0 Use the calculated Patient Ratio above and the CHD Risk table  to determine the patient's CHD Risk.    Narrative   Performed at:  [facility]                [address]                [place], [place] [address]  COMPLETE METABOLIC PANEL WITH GFR  Result Value Ref Range   Sodium 134 (L) 135 - 146 mmol/L   Potassium 4.3 3.5 - 5.3 mmol/L   Chloride 101 98 - 110 mmol/L   CO2 28 20 - 31 mmol/L   Glucose, Bld 101 (H) 65 - 99 mg/dL   BUN 17 7 - 25 mg/dL   Creat 0.90 0.60 - 0.93 mg/dL     Comment:       For patients > or = 70 years of age: The upper reference limit for Creatinine is approximately 13% higher for people identified as African-American.      Total Bilirubin 0.5 0.2 - 1.2 mg/dL   Alkaline Phosphatase 98 33 - 130 U/L   AST 17 10 - 35 U/L   ALT 13 6 - 29 U/L   Total Protein 7.3 6.1 - 8.1 g/dL   Albumin 4.3 3.6 - 5.1 g/dL   Calcium 9.5 8.6 - 10.4 mg/dL   GFR, Est African American 75 >=60 mL/min   GFR, Est Non African American 65 >=60 mL/min   Narrative   Performed at:  [address], [address]                [place], [place]  [address]  TSH  Result Value Ref Range   TSH 2.39 mIU/L     Comment:       Reference  Range   > or = 20 Years  0.40-4.50   Pregnancy Range First trimester  0.26-2.66 Second trimester 0.55-2.73 Third trimester  0.43-2.91      Narrative   Performed at:  [address], [address]                [place], [place] [address]  VITAMIN D 25 Hydroxy (Vit-D Deficiency, Fractures)  Result Value Ref Range   Vit D, 25-Hydroxy 63 30 - 100 ng/mL     Comment:     Vitamin D Status           25-OH Vitamin D        Deficiency                <20 ng/mL        Insufficiency         20 - 29 ng/mL        Optimal             > or = 30 ng/mL   For 25-OH Vitamin D testing on patients on D2-supplementation and patients for whom quantitation of D2 and D3 fractions is required, the QuestAssureD 25-OH VIT D, (D2,D3), LC/MS/MS is recommended: order code [id number] (patients > 2 yrs).    Narrative   Performed at:  [address], [address]                [place], [place] [address]  CBC with Differential/Platelet  Result Value Ref Range   WBC 7.9 3.8 - 10.8 K/uL   RBC 5.13 (H) 3.80 - 5.10 MIL/uL   Hemoglobin 14.6 11.7 - 15.5 g/dL   HCT 43.5 35.0 - 45.0 %   MCV 84.8 80.0 - 100.0 fL   MCH 28.5 27.0 - 33.0 pg   MCHC 33.6 32.0 - 36.0 g/dL   RDW 15.3 (H) 11.0 - 15.0 %   Platelets 295 140 - 400 K/uL   MPV 10.9 7.5 - 12.5 fL   Neutro Abs 5,293 1,500 - 7,800 cells/uL   Lymphs Abs 1,817 850 - 3,900 cells/uL   Monocytes Absolute 632 200 - 950 cells/uL   Eosinophils Absolute 158 15 - 500 cells/uL   Basophils Absolute 0 0 - 200 cells/uL   Neutrophils Relative % 67 %   Lymphocytes Relative 23 %   Monocytes Relative 8 %   Eosinophils Relative 2 %   Basophils Relative 0 %   Smear Review Criteria for review not met    Narrative   Performed at:  [address], [address]                [place], [place] [address]     If you have any questions or concerns, please don't hesitate to  call.  Sincerely,   Molli Barrows, FNP

## 2016-05-01 ENCOUNTER — Encounter: Payer: Self-pay | Admitting: Physician Assistant

## 2016-05-14 ENCOUNTER — Ambulatory Visit: Payer: Medicare HMO | Admitting: Physician Assistant

## 2016-06-27 ENCOUNTER — Ambulatory Visit (INDEPENDENT_AMBULATORY_CARE_PROVIDER_SITE_OTHER): Payer: Medicare HMO | Admitting: Family Medicine

## 2016-06-27 VITALS — BP 112/74 | HR 103 | Temp 98.3°F | Resp 18 | Ht 63.0 in | Wt 192.0 lb

## 2016-06-27 DIAGNOSIS — M545 Low back pain, unspecified: Secondary | ICD-10-CM

## 2016-06-27 DIAGNOSIS — G8929 Other chronic pain: Secondary | ICD-10-CM

## 2016-06-27 MED ORDER — PREDNISONE 20 MG PO TABS: 40.0000 mg | ORAL_TABLET | Freq: Every day | ORAL | 0 refills | Status: DC

## 2016-06-27 NOTE — Progress Notes (Signed)
Patient ID: Bonnie Lawson, female    DOB: Jun 16, 1946, 70 y.o.   MRN: [id number]  PCP: Molli Barrows, FNP  Chief Complaint  Patient presents with  . Back Pain    old pulled muscle injury    Subjective:   HPI 70 year old female presents for evaluation of an old muscle injury. Started about one month ago while she was visiting family out of state. Continuous pain for a few days and now more of a persistent aching.  At one point she reports pain was so intense she was struggling to stand. She hasn't used heat or cold application to back. Never seen a back pain specialist. No new injury this is a chronic condition. Denies any pain radiating into lower leg, pain is more localized to the left side of lower back. Hx of lower lumbar surgery related to a herniated disk 2016.  Social History   Social History  . Marital status: Divorced    Spouse name: N/A  . Number of children: N/A  . Years of education: N/A   Occupational History  . Not on file.   Social History Main Topics  . Smoking status: Current Some Day Smoker  . Smokeless tobacco: Current User  . Alcohol use No  . Drug use: No  . Sexual activity: Not on file   Other Topics Concern  . Not on file   Social History Narrative  . No narrative on file    History reviewed. No pertinent family history. Review of Systems  See HPI   Patient Active Problem List   Diagnosis Date Noted  . Abdominal pain, chronic, epigastric 04/25/2016  . Abdominal bloating 04/25/2016  . Leukocytosis 04/25/2016  . Insomnia 04/25/2016  . Essential hypertension 04/25/2016  . Chronic pain 04/25/2016     Prior to Admission medications   Medication Sig Start Date End Date Taking? Authorizing Provider  aspirin EC 81 MG tablet Take 81 mg by mouth daily.   Yes Historical Provider, MD  cyclobenzaprine (FLEXERIL) 10 MG tablet Take 1 tablet (10 mg total) by mouth 3 (three) times daily as needed for muscle spasms. 04/26/16  Yes Sedalia Muta, FNP  levothyroxine (SYNTHROID, LEVOTHROID) 75 MCG tablet Take 1 tablet (75 mcg total) by mouth daily. 04/28/16  Yes Sedalia Muta, FNP  lisinopril-hydrochlorothiazide (PRINZIDE,ZESTORETIC) 10-12.5 MG tablet Take 2 tablets by mouth daily. 04/22/16  Yes Sedalia Muta, FNP  omeprazole (PRILOSEC) 40 MG capsule Take 1 capsule (40 mg total) by mouth daily. 04/22/16  Yes Sedalia Muta, FNP  pregabalin (LYRICA) 150 MG capsule Take 1 capsule (150 mg total) by mouth 2 (two) times daily. 06/23/16 07/23/16 Yes Sedalia Muta, FNP  traMADol (ULTRAM) 50 MG tablet Take 1 tablet (50 mg total) by mouth every 8 (eight) hours as needed. 04/26/16  Yes Sedalia Muta, FNP  venlafaxine XR (EFFEXOR XR) 75 MG 24 hr capsule Take 1 capsule (75 mg total) by mouth daily with breakfast. 04/26/16  Yes Sedalia Muta, FNP  zolpidem (AMBIEN) 10 MG tablet Take 1 tablet (10 mg total) by mouth at bedtime as needed for sleep. 06/20/16 07/20/16 Yes Sedalia Muta, FNP  zolpidem (AMBIEN) 10 MG tablet Take 1 tablet (10 mg total) by mouth at bedtime as needed for sleep. 05/08/16 05/18/16  Sedalia Muta, FNP  zolpidem (AMBIEN) 10 MG tablet Take 1 tablet (10 mg total) by mouth at bedtime as needed for sleep. 05/19/16 06/19/16  Sedalia Muta, [place]  Allergies  Allergen Reactions  . Sulfa Antibiotics      Objective:  Physical Exam  Constitutional: She is oriented to person, place, and time. She appears well-developed and well-nourished.  HENT:  Head: Normocephalic and atraumatic.  Right Ear: External ear normal.  Left Ear: External ear normal.  Eyes: Conjunctivae are normal. Pupils are equal, round, and reactive to light.  Neck: Normal range of motion. Neck supple.  Cardiovascular: Normal rate, regular rhythm, normal heart sounds and intact distal pulses.   Pulmonary/Chest: Effort normal and breath sounds normal.  Musculoskeletal:         Lumbar back: She exhibits tenderness, bony tenderness and pain.  Neurological: She is alert and oriented to person, place, and time.  Skin: Skin is warm and dry.  Psychiatric: She has a normal mood and affect. Her behavior is normal. Thought content normal.    Vitals:   06/27/16 1013  BP: 112/74  Pulse: (!) 103  Resp: 18  Temp: 98.3 F (36.8 C)     Assessment & Plan:  1. Chronic bilateral low back pain without sciatica - Ambulatory referral to Physical Therapy  Plan:  Explained to patient that she is currently taking several sedating medications two which are indicated for pain management, pregabalin (Lyrica) and tramadol (Ultram).  Will treat with anti-inflammatory and muscle relaxants. Since pain has been chronic, I have placed a referral for physical therapy.  . predniSONE (DELTASONE) 20 MG tablet    Sig: Take 2 tablets (40 mg total) by mouth daily with breakfast.    Dispense:  10 tablet   Continue cyclobenzaprine 10 mg up to 3 times daily for back pain and or spasms.  Someone will contact you regarding scheduling your appointment for physical therapy.  Follow-up as needed.  Carroll Sage. Kenton Kingfisher, MSN, FNP-C [facility]

## 2016-06-27 NOTE — Patient Instructions (Addendum)
Start Prednisone 40 mg once daily for 5 days.  Take Cyclobenzaprine 10 mg up to 3 times daily for back pain.  I have placed a referral for physical therapy.     IF you received an x-ray today, you will receive an invoice from [facility]. Please contact [facility] at [phone] with questions or concerns regarding your invoice.   IF you received labwork today, you will receive an invoice from [facility]. Please contact [facility] at [phone] with questions or concerns regarding your invoice.   Our billing staff will not be able to assist you with questions regarding bills from these companies.  You will be contacted with the lab results as soon as they are available. The fastest way to get your results is to activate your My Chart account. Instructions are located on the last page of this paperwork. If you have not heard from [place] regarding the results in 2 weeks, please contact this office.     Back Exercises If you have pain in your back, do these exercises 2-3 times each day or as told by your doctor. When the pain goes away, do the exercises once each day, but repeat the steps more times for each exercise (do more repetitions). If you do not have pain in your back, do these exercises once each day or as told by your doctor. EXERCISES Single Knee to Chest Do these steps 3-5 times in a row for each leg: 1. Lie on your back on a firm bed or the floor with your legs stretched out. 2. Bring one knee to your chest. 3. Hold your knee to your chest by grabbing your knee or thigh. 4. Pull on your knee until you feel a gentle stretch in your lower back. 5. Keep doing the stretch for 10-30 seconds. 6. Slowly let go of your leg and straighten it. Pelvic Tilt Do these steps 5-10 times in a row: 1. Lie on your back on a firm bed or the floor with your legs stretched out. 2. Bend your knees so they point up to the ceiling. Your feet should be flat  on the floor. 3. Tighten your lower belly (abdomen) muscles to press your lower back against the floor. This will make your tailbone point up to the ceiling instead of pointing down to your feet or the floor. 4. Stay in this position for 5-10 seconds while you gently tighten your muscles and breathe evenly. Cat-Cow Do these steps until your lower back bends more easily: 1. Get on your hands and knees on a firm surface. Keep your hands under your shoulders, and keep your knees under your hips. You may put padding under your knees. 2. Let your head hang down, and make your tailbone point down to the floor so your lower back is round like the back of a cat. 3. Stay in this position for 5 seconds. 4. Slowly lift your head and make your tailbone point up to the ceiling so your back hangs low (sags) like the back of a cow. 5. Stay in this position for 5 seconds. Press-Ups Do these steps 5-10 times in a row: 1. Lie on your belly (face-down) on the floor. 2. Place your hands near your head, about shoulder-width apart. 3. While you keep your back relaxed and keep your hips on the floor, slowly straighten your arms to raise the top half of your body and lift your shoulders. Do not use your back muscles. To make yourself more  comfortable, you may change where you place your hands. 4. Stay in this position for 5 seconds. 5. Slowly return to lying flat on the floor. Bridges Do these steps 10 times in a row: 1. Lie on your back on a firm surface. 2. Bend your knees so they point up to the ceiling. Your feet should be flat on the floor. 3. Tighten your butt muscles and lift your butt off of the floor until your waist is almost as high as your knees. If you do not feel the muscles working in your butt and the back of your thighs, slide your feet 1-2 inches farther away from your butt. 4. Stay in this position for 3-5 seconds. 5. Slowly lower your butt to the floor, and let your butt muscles relax. If this  exercise is too easy, try doing it with your arms crossed over your chest. Belly Crunches Do these steps 5-10 times in a row: 1. Lie on your back on a firm bed or the floor with your legs stretched out. 2. Bend your knees so they point up to the ceiling. Your feet should be flat on the floor. 3. Cross your arms over your chest. 4. Tip your chin a little bit toward your chest but do not bend your neck. 5. Tighten your belly muscles and slowly raise your chest just enough to lift your shoulder blades a tiny bit off of the floor. 6. Slowly lower your chest and your head to the floor. Back Lifts Do these steps 5-10 times in a row: 1. Lie on your belly (face-down) with your arms at your sides, and rest your forehead on the floor. 2. Tighten the muscles in your legs and your butt. 3. Slowly lift your chest off of the floor while you keep your hips on the floor. Keep the back of your head in line with the curve in your back. Look at the floor while you do this. 4. Stay in this position for 3-5 seconds. 5. Slowly lower your chest and your face to the floor. GET HELP IF:  Your back pain gets a lot worse when you do an exercise.  Your back pain does not lessen 2 hours after you exercise. If you have any of these problems, stop doing the exercises. Do not do them again unless your doctor says it is okay. GET HELP RIGHT AWAY IF:  You have sudden, very bad back pain. If this happens, stop doing the exercises. Do not do them again unless your doctor says it is okay.   This information is not intended to replace advice given to you by your health care provider. Make sure you discuss any questions you have with your health care provider.   Document Released: 09/06/2010 Document Revised: 04/25/2015 Document Reviewed: 09/28/2014 Elsevier Interactive Patient Education [employer].

## 2016-06-30 ENCOUNTER — Other Ambulatory Visit: Payer: Self-pay | Admitting: Family Medicine

## 2016-07-05 ENCOUNTER — Other Ambulatory Visit: Payer: Self-pay | Admitting: Family Medicine

## 2016-07-08 ENCOUNTER — Ambulatory Visit: Payer: Medicare HMO | Admitting: Physical Therapy

## 2016-07-22 ENCOUNTER — Telehealth: Payer: Self-pay | Admitting: Physical Therapy

## 2016-07-22 NOTE — Telephone Encounter (Signed)
07/08/16 cxl PT eval 07/22/16 spoke with patient, wants to wait to schedule PT after holidays, will call back

## 2016-08-20 ENCOUNTER — Other Ambulatory Visit: Payer: Self-pay | Admitting: Family Medicine

## 2016-08-21 ENCOUNTER — Other Ambulatory Visit: Payer: Self-pay

## 2016-08-21 NOTE — Telephone Encounter (Signed)
Line busy rx up front for pick up

## 2016-08-21 NOTE — Telephone Encounter (Signed)
Pt is needing a refill on lyrica and [employer] number [phone]

## 2016-08-23 NOTE — Telephone Encounter (Signed)
08/21/16 these were refilled

## 2016-08-25 ENCOUNTER — Other Ambulatory Visit: Payer: Self-pay | Admitting: Family Medicine

## 2016-08-25 MED ORDER — ZOLPIDEM TARTRATE 10 MG PO TABS: 10.0000 mg | ORAL_TABLET | Freq: Every evening | ORAL | 0 refills | Status: DC | PRN

## 2016-08-25 MED ORDER — PREGABALIN 150 MG PO CAPS: 150.0000 mg | ORAL_CAPSULE | Freq: Two times a day (BID) | ORAL | 0 refills | Status: DC

## 2016-08-25 NOTE — Progress Notes (Signed)
Pt advised, rx up front

## 2016-08-25 NOTE — Progress Notes (Unsigned)
Patient will require an office visit before any additional medications can be prescribed.  She is three controlled medications, Tramadol, Lyrica, and Ambien. I will refill Ambien for 30 days, she needs to make an appointment for follow-up before anymore medications can be refilled.

## 2016-08-25 NOTE — Telephone Encounter (Signed)
lyrica and ambien needs ov Pt tx up front for appt.

## 2016-08-27 ENCOUNTER — Ambulatory Visit: Payer: Medicare HMO | Admitting: Family Medicine

## 2016-09-03 ENCOUNTER — Ambulatory Visit: Payer: Medicare HMO | Admitting: Family Medicine

## 2016-09-05 ENCOUNTER — Ambulatory Visit (INDEPENDENT_AMBULATORY_CARE_PROVIDER_SITE_OTHER): Payer: Medicare HMO | Admitting: Family Medicine

## 2016-09-05 VITALS — BP 100/60 | HR 97 | Temp 98.6°F | Resp 16 | Ht 62.0 in | Wt 189.4 lb

## 2016-09-05 DIAGNOSIS — G4700 Insomnia, unspecified: Secondary | ICD-10-CM

## 2016-09-05 DIAGNOSIS — G894 Chronic pain syndrome: Secondary | ICD-10-CM | POA: Diagnosis not present

## 2016-09-05 DIAGNOSIS — F330 Major depressive disorder, recurrent, mild: Secondary | ICD-10-CM

## 2016-09-05 MED ORDER — PREGABALIN 150 MG PO CAPS: 150.0000 mg | ORAL_CAPSULE | Freq: Two times a day (BID) | ORAL | 0 refills | Status: DC

## 2016-09-05 MED ORDER — VENLAFAXINE HCL ER 75 MG PO CP24: 75.0000 mg | ORAL_CAPSULE | Freq: Two times a day (BID) | ORAL | 3 refills | Status: DC

## 2016-09-05 MED ORDER — ZOLPIDEM TARTRATE 10 MG PO TABS: 10.0000 mg | ORAL_TABLET | Freq: Every evening | ORAL | 0 refills | Status: DC | PRN

## 2016-09-05 NOTE — Progress Notes (Signed)
Patient ID: Bonnie Lawson, female    DOB: 11-Jun-1946, 71 y.o.   MRN: [id number]  PCP: Molli Barrows, FNP  Chief Complaint  Patient presents with  . Medication Refill    Pregabalin(Lyrica) 150 mg, Zolpidem Tartrate 10 mg Ambien, depression score during triage 14    Subjective:  HPI 71 year old female presents for evaluation of medication refills. Pt has a past medical hx of chronic pain, insomnia, and depression.  Chronic Pain Reports that Lyrica 150 mg twice daily is effective in managing her chronic pain.  Insomnia  Hx of taking Ambien long-term for insomnia management.  Reports she takes nightly to induce sleep.  Depression Reports during this time of year her depressive symptoms are more pronounced as she is confined more the house as she dislikes cold weather. Denies feelings of hopelessness or self harm.  Pt would also like medications filled for 3 months.  Social History   Social History  . Marital status: Divorced    Spouse name: N/A  . Number of children: N/A  . Years of education: N/A   Occupational History  . Not on file.   Social History Main Topics  . Smoking status: Current Some Day Smoker  . Smokeless tobacco: Current User  . Alcohol use No  . Drug use: No  . Sexual activity: Not on file   Other Topics Concern  . Not on file   Social History Narrative  . No narrative on file    No family history on file.  Review of Systems See HPI Patient Active Problem List   Diagnosis Date Noted  . Abdominal pain, chronic, epigastric 04/25/2016  . Abdominal bloating 04/25/2016  . Leukocytosis 04/25/2016  . Insomnia 04/25/2016  . Essential hypertension 04/25/2016  . Chronic pain 04/25/2016    Allergies  Allergen Reactions  . Sulfa Antibiotics     Prior to Admission medications   Medication Sig Start Date End Date Taking? Authorizing Provider  aspirin EC 81 MG tablet Take 81 mg by mouth daily.   Yes Historical Provider, MD  cyclobenzaprine  (FLEXERIL) 10 MG tablet Take 1 tablet (10 mg total) by mouth 3 (three) times daily as needed for muscle spasms. 04/26/16  Yes Sedalia Muta, FNP  levothyroxine (SYNTHROID, LEVOTHROID) 75 MCG tablet Take 1 tablet (75 mcg total) by mouth daily. 04/28/16  Yes Sedalia Muta, FNP  lisinopril-hydrochlorothiazide (PRINZIDE,ZESTORETIC) 10-12.5 MG tablet Take 2 tablets by mouth daily. 04/22/16  Yes Sedalia Muta, FNP  omeprazole (PRILOSEC) 40 MG capsule Take 1 capsule (40 mg total) by mouth daily. 04/22/16  Yes Sedalia Muta, FNP  pregabalin (LYRICA) 150 MG capsule Take 1 capsule (150 mg total) by mouth 2 (two) times daily. 08/25/16  Yes Sedalia Muta, FNP  traMADol (ULTRAM) 50 MG tablet Take 1 tablet (50 mg total) by mouth every 8 (eight) hours as needed. 04/26/16  Yes Sedalia Muta, FNP  venlafaxine XR (EFFEXOR XR) 75 MG 24 hr capsule Take 1 capsule (75 mg total) by mouth daily with breakfast. 04/26/16  Yes Sedalia Muta, FNP  zolpidem (AMBIEN) 10 MG tablet Take 1 tablet (10 mg total) by mouth at bedtime as needed. for sleep 08/25/16  Yes Sedalia Muta, FNP  cyclobenzaprine (FLEXERIL) 10 MG tablet TAKE 1 TABLET(10 MG) BY MOUTH THREE TIMES DAILY AS NEEDED FOR MUSCLE SPASMS 07/08/16   Sedalia Muta, FNP  zolpidem (AMBIEN) 10 MG tablet Take 1 tablet (10 mg total) by mouth at bedtime as  needed for sleep. 05/08/16 05/18/16  Sedalia Muta, FNP  zolpidem (AMBIEN) 10 MG tablet Take 1 tablet (10 mg total) by mouth at bedtime as needed for sleep. 06/20/16 07/20/16  Sedalia Muta, FNP  zolpidem (AMBIEN) 10 MG tablet Take 1 tablet (10 mg total) by mouth at bedtime as needed for sleep. 05/19/16 06/19/16  Sedalia Muta, FNP    Past Medical, Surgical Family and Social History reviewed and updated.    Objective:   Today's Vitals   09/05/16 1126  BP: 100/60  Pulse: 97  Resp: 16  Temp: 98.6 F  (37 C)  TempSrc: Oral  SpO2: 94%  Weight: 189 lb 6.4 oz (85.9 kg)  Height: 5\' 2"  (1.575 m)    Wt Readings from Last 3 Encounters:  09/05/16 189 lb 6.4 oz (85.9 kg)  06/27/16 192 lb (87.1 kg)  04/26/16 190 lb 3.2 oz (86.3 kg)   Physical Exam  Constitutional: She is oriented to person, place, and time. She appears well-developed and well-nourished.  Cardiovascular: Normal rate, regular rhythm, normal heart sounds and intact distal pulses.   Pulmonary/Chest: She has decreased breath sounds in the right upper field, the right middle field, the right lower field, the left upper field, the left middle field and the left lower field.  Musculoskeletal: Normal range of motion.  Neurological: She is alert and oriented to person, place, and time.  Skin: Skin is warm and dry.  Psychiatric: She has a normal mood and affect. Her behavior is normal. Judgment and thought content normal.      Assessment & Plan:  1. Chronic pain syndrome - Pregabalin (Lyrica) 150 mg, twice daily-refilled for 3 months with 3 individual prescriptions.  2. Insomnia, unspecified type -Zolpidem (Ambien) 10 mg at bedtime, as needed for sleep. Refilled for 3 months with 3 individual prescriptions.  3. Mild episode of recurrent major depressive disorder (HCC) -Increased dose to 75 mg twice daily to improve mood.  Advised patient that I would like for her to establish with Dr. Bridget Hartshorn and follow-up with her in 3 months.  Carroll Sage. Kenton Kingfisher, MSN, FNP-C [facility] [place]

## 2016-09-05 NOTE — Patient Instructions (Addendum)
Please schedule an appointment with Dr. Delia Chimes in 3 months.    IF you received an x-ray today, you will receive an invoice from [facility]. Please contact [facility] at [phone] with questions or concerns regarding your invoice.   IF you received labwork today, you will receive an invoice from [facility]. Please contact [facility] at [phone] with questions or concerns regarding your invoice.   Our billing staff will not be able to assist you with questions regarding bills from these companies.  You will be contacted with the lab results as soon as they are available. The fastest way to get your results is to activate your My Chart account. Instructions are located on the last page of this paperwork. If you have not heard from [place] regarding the results in 2 weeks, please contact this office.

## 2016-09-12 ENCOUNTER — Other Ambulatory Visit: Payer: Self-pay | Admitting: Family Medicine

## 2016-09-12 ENCOUNTER — Other Ambulatory Visit: Payer: Self-pay | Admitting: Physician Assistant

## 2016-09-12 NOTE — Telephone Encounter (Signed)
Meds ordered this encounter  Medications  . omeprazole (PRILOSEC) 40 MG capsule    Sig: TAKE 1 CAPSULE(40 MG) BY MOUTH DAILY    Dispense:  90 capsule    Refill:  0    **Patient requests 90 days supply**

## 2016-10-09 ENCOUNTER — Telehealth: Payer: Self-pay

## 2016-10-09 NOTE — Telephone Encounter (Signed)
This is a long term medication  °

## 2016-10-09 NOTE — Telephone Encounter (Signed)
Needs pa cyclobenzaprine Alternate med  [email or web address]  Or call [phone]   Is this a long term med?

## 2016-10-10 NOTE — Telephone Encounter (Signed)
Plan was declined due to insurance coverage patient notified to verify insurance.

## 2016-10-15 ENCOUNTER — Encounter: Payer: Self-pay | Admitting: Family Medicine

## 2016-10-15 NOTE — Telephone Encounter (Signed)
Please review

## 2016-10-15 NOTE — Telephone Encounter (Signed)
Letter mailed to patient advising cyclobenzaprine is not covered by her current insurance provider.

## 2016-10-15 NOTE — Progress Notes (Signed)
October 15, 2016  Patient: Bonnie Lawson  MRN: [id number]  Date of Birth: July 25, 1946  Date of Visit: 10/15/2016    Dear [name],  Please contact your insurance company regarding prescription drug coverage as they have declined to cover the cyclobenzaprine previously prescribed for you. If you have any additional questions, please contact us at [phone].        Sincerely,  [facility] at [facility]

## 2016-10-16 ENCOUNTER — Other Ambulatory Visit: Payer: Self-pay | Admitting: Family Medicine

## 2016-10-16 NOTE — Telephone Encounter (Signed)
Meds ordered this encounter  Medications  . lisinopril-hydrochlorothiazide (PRINZIDE,ZESTORETIC) 10-12.5 MG tablet    Sig: TAKE 2 TABLETS BY MOUTH DAILY    Dispense:  180 tablet    Refill:  0    Visit with Dr. Nolon Rod 12/12/2016

## 2016-10-25 ENCOUNTER — Other Ambulatory Visit: Payer: Self-pay | Admitting: Family Medicine

## 2016-10-26 NOTE — Telephone Encounter (Signed)
06/2016 last refills

## 2016-11-05 ENCOUNTER — Encounter: Payer: Self-pay | Admitting: Family Medicine

## 2016-11-05 ENCOUNTER — Ambulatory Visit (INDEPENDENT_AMBULATORY_CARE_PROVIDER_SITE_OTHER): Payer: Medicare HMO | Admitting: Family Medicine

## 2016-11-05 VITALS — BP 139/84 | HR 87 | Temp 98.3°F | Resp 16 | Ht 62.5 in | Wt 193.4 lb

## 2016-11-05 DIAGNOSIS — F17200 Nicotine dependence, unspecified, uncomplicated: Secondary | ICD-10-CM

## 2016-11-05 DIAGNOSIS — R69 Illness, unspecified: Secondary | ICD-10-CM | POA: Diagnosis not present

## 2016-11-05 DIAGNOSIS — F5101 Primary insomnia: Secondary | ICD-10-CM

## 2016-11-05 DIAGNOSIS — Z5181 Encounter for therapeutic drug level monitoring: Secondary | ICD-10-CM

## 2016-11-05 DIAGNOSIS — R05 Cough: Secondary | ICD-10-CM

## 2016-11-05 DIAGNOSIS — I10 Essential (primary) hypertension: Secondary | ICD-10-CM

## 2016-11-05 DIAGNOSIS — F321 Major depressive disorder, single episode, moderate: Secondary | ICD-10-CM

## 2016-11-05 DIAGNOSIS — G4733 Obstructive sleep apnea (adult) (pediatric): Secondary | ICD-10-CM

## 2016-11-05 DIAGNOSIS — F32A Depression, unspecified: Secondary | ICD-10-CM

## 2016-11-05 DIAGNOSIS — R059 Cough, unspecified: Secondary | ICD-10-CM

## 2016-11-05 DIAGNOSIS — R8290 Unspecified abnormal findings in urine: Secondary | ICD-10-CM

## 2016-11-05 DIAGNOSIS — Z780 Asymptomatic menopausal state: Secondary | ICD-10-CM | POA: Diagnosis not present

## 2016-11-05 LAB — POCT URINALYSIS DIP (MANUAL ENTRY)
BILIRUBIN UA: NEGATIVE
Blood, UA: NEGATIVE
GLUCOSE UA: NEGATIVE
Ketones, POC UA: NEGATIVE
NITRITE UA: NEGATIVE
Protein Ur, POC: NEGATIVE
Spec Grav, UA: 1.015 (ref 1.030–1.035)
UROBILINOGEN UA: 1 (ref ?–2.0)
pH, UA: 7 (ref 5.0–8.0)

## 2016-11-05 MED ORDER — FLUTICASONE PROPIONATE 50 MCG/ACT NA SUSP: 2.0000 | Freq: Every day | NASAL | 6 refills | Status: DC

## 2016-11-05 MED ORDER — PREGABALIN 150 MG PO CAPS: 150.0000 mg | ORAL_CAPSULE | Freq: Two times a day (BID) | ORAL | 1 refills | Status: DC

## 2016-11-05 MED ORDER — ZOLPIDEM TARTRATE 10 MG PO TABS: 10.0000 mg | ORAL_TABLET | Freq: Every evening | ORAL | 0 refills | Status: DC | PRN

## 2016-11-05 MED ORDER — VENLAFAXINE HCL ER 150 MG PO CP24: 150.0000 mg | ORAL_CAPSULE | Freq: Two times a day (BID) | ORAL | 6 refills | Status: DC

## 2016-11-05 NOTE — Progress Notes (Signed)
Chief Complaint  Patient presents with  . CHEST CONGESTION    3-4 WEEKS  . Cough    PRODUCTIVE - YELLOW MUCUS  . Medication Refill    LYRICA and ambien  . Depression    positive answers in triage    HPI  Insomnia and OSA Pt reports that she has been on [place] for years She was started on [place] for years She denies hallucinations or sleep walking She had a sleep study and was diagnosed with sleep apnea She has not used a cpap in years She does not remember why she stopped using it  She reports that she would often fall    Hypertension: Patient here for follow-up of elevated blood pressure. She is not exercising and is not adherent to low salt diet.  Blood pressure is well controlled at home. Cardiac symptoms none. Patient denies chest pain, dyspnea, irregular heart beat, lower extremity edema and palpitations.  Cardiovascular risk factors: advanced age (older than 41 for men, 26 for women), hypertension, obesity (BMI >= 30 kg/m2) and sedentary lifestyle. Use of agents associated with hypertension: none. History of target organ damage: none. BP Readings from Last 3 Encounters:  11/05/16 139/84  09/05/16 100/60  06/27/16 112/74   Encounter for Medication Monitoring Pt reports that she needs refills of Flexeril for muscle spasms.  She takes it 2-3 times a week. She takes lyrica for neuropathy and ambien for insomnia She is not compliant with the cpap for sleep apnea  Major Depression Pt has a history of depression She is on Effexor 75mg  twice a day She does not exercise She has no seen much improvement in her depression She does not get counseling She reports that she does not need counseling. She thinks that the death of her mother at age 18 2016-10-21 has kept her mood low.  Depression screen [facility] 2/9 11/05/2016 09/05/2016 06/27/2016 04/26/2016 04/22/2016  Decreased Interest 1 1 0 0 0  Down, Depressed, Hopeless 1 3 0 0 1  PHQ - 2 Score 2 4 0 0 1  Altered sleeping 1 3 - - -  Tired,  decreased energy 3 1 - - -  Change in appetite 1 0 - - -  Feeling bad or failure about yourself  2 3 - - -  Trouble concentrating 3 3 - - -  Moving slowly or fidgety/restless 2 0 - - -  Suicidal thoughts 0 0 - - -  PHQ-9 Score 14 14 - - -  Difficult doing work/chores - Somewhat difficult - - -    Cough Pt reports a month long cough Nonproductive No fevers  Reports postnasal drip She smokes when she goes outside She states that she uses a vape with 11mg  of nicotine indoors She denies runny nose or eat pain  No past medical history on file.  Current Outpatient Prescriptions  Medication Sig Dispense Refill  . amitriptyline (ELAVIL) 50 MG tablet Take 50 mg by mouth at bedtime.    Marland Kitchen aspirin EC 81 MG tablet Take 81 mg by mouth daily.    Marland Kitchen HYDROcodone-acetaminophen (NORCO/VICODIN) 5-325 MG tablet Take 1 tablet by mouth every 6 (six) hours as needed for moderate pain.    Marland Kitchen levothyroxine (SYNTHROID, LEVOTHROID) 75 MCG tablet Take 1 tablet (75 mcg total) by mouth daily. 90 tablet 3  . lisinopril-hydrochlorothiazide (PRINZIDE,ZESTORETIC) 10-12.5 MG tablet TAKE 2 TABLETS BY MOUTH DAILY 180 tablet 0  . omeprazole (PRILOSEC) 40 MG capsule TAKE 1 CAPSULE(40 MG) BY MOUTH DAILY 90 capsule  0  . traMADol (ULTRAM) 50 MG tablet Take 1 tablet (50 mg total) by mouth every 8 (eight) hours as needed. 30 tablet 0  . zolpidem (AMBIEN) 10 MG tablet Take 1 tablet (10 mg total) by mouth at bedtime as needed for sleep. 30 tablet 0  . fluticasone (FLONASE) 50 MCG/ACT nasal spray Place 2 sprays into both nostrils daily. 16 g 6  . pregabalin (LYRICA) 150 MG capsule Take 1 capsule (150 mg total) by mouth 2 (two) times daily. 180 capsule 1  . venlafaxine XR (EFFEXOR-XR) 150 MG 24 hr capsule Take 1 capsule (150 mg total) by mouth 2 (two) times daily with a meal. 60 capsule 6   No current facility-administered medications for this visit.     Allergies:  Allergies  Allergen Reactions  . Sulfa Antibiotics     No  past surgical history on file.  Social History   Social History  . Marital status: Divorced    Spouse name: N/A  . Number of children: N/A  . Years of education: N/A   Social History Main Topics  . Smoking status: Current Some Day Smoker  . Smokeless tobacco: Current User  . Alcohol use No  . Drug use: No  . Sexual activity: Not Asked   Other Topics Concern  . None   Social History Narrative  . None    ROS  Objective: Vitals:   11/05/16 1056 11/05/16 1134  BP: (!) 150/83 139/84  Pulse: 87   Resp: 16   Temp: 98.3 F (36.8 C)   TempSrc: Oral   SpO2: 95%   Weight: 193 lb 6.4 oz (87.7 kg)   Height: 5' 2.5" (1.588 m)     Physical Exam  Constitutional: She is oriented to person, place, and time. She appears well-developed and well-nourished.  HENT:  Head: Normocephalic and atraumatic.  Right Ear: External ear normal.  Left Ear: External ear normal.  Nose: Nose normal.  Mouth/Throat: Oropharynx is clear and moist. No oropharyngeal exudate.  Eyes: Conjunctivae and EOM are normal. Right eye exhibits no discharge. Left eye exhibits no discharge. No scleral icterus.  Neck: Normal range of motion. Neck supple.  Cardiovascular: Normal rate, regular rhythm, normal heart sounds and intact distal pulses.   No murmur heard. Pulmonary/Chest: Effort normal and breath sounds normal. No respiratory distress. She has no wheezes.  Abdominal: Soft. Bowel sounds are normal. She exhibits no distension. There is no tenderness. There is no guarding.  Neurological: She is alert and oriented to person, place, and time.  Skin: Skin is warm. Capillary refill takes less than 2 seconds. No erythema.  Psychiatric: She has a normal mood and affect. Her behavior is normal. Judgment and thought content normal.    Assessment and Plan Jamilynn was seen today for chest congestion, cough, medication refill and depression.  Diagnoses and all orders for this visit:  Obstructive sleep apnea  syndrome -     Cpap titration; Future Encounter for medication monitoring- discussed medication interactions and compliance Menopause -     DG Bone Density; Future Cough- flonase and smoking cessation advised Pt lung exam clear  Abnormal urine odor- no infection -     POCT urinalysis dipstick   Other orders -     fluticasone (FLONASE) 50 MCG/ACT nasal spray; Place 2 sprays into both nostrils daily. -     zolpidem (AMBIEN) 10 MG tablet; Take 1 tablet (10 mg total) by mouth at bedtime as needed for sleep. -  pregabalin (LYRICA) 150 MG capsule; Take 1 capsule (150 mg total) by mouth 2 (two) times daily.   Problem List Items Addressed This Visit      Cardiovascular and Mediastinum   Essential hypertension   Uncontrolled hypertension    Patient to continue her current bp medications.  Her previous bp levels were low for her age.  At this time there is no steady baseline. Will leave her at her current dose and recheck her bp closely.        Respiratory   Obstructive sleep apnea syndrome - Primary    Discussed the importance of getting proper treatment for sleep apnea Discussed that if left untreated it can lead to worsening insomnia and creates an increased risk for complications when benzos are being used.  Also discussed that it can lead to hypertension and right heart strain. Referral placed for cpap titration.  Pt will need new supplies.        Relevant Orders   Cpap titration     Other   Insomnia    Will send for sleep study for cpap titration Discussed that she is on many medications with overlapping side effects of drowsiness.  She will not get flexeril refills so this was discontinued.  Discussed that the plan will be to rely on the lyrica and amitriptyline for sleep and less on the ambien      Moderate depressive episode (HCC)   Relevant Medications   amitriptyline (ELAVIL) 50 MG tablet   venlafaxine XR (EFFEXOR-XR) 150 MG 24 hr capsule   Menopause    Patient  has not had screening for bone density disorder.  Will refer for bone density scan.  Discussed with patient that she is a postmenopausal Caucasian female with a long history of smoking so she should be screened.  She agreed to screening. Referrals were placed.       Relevant Orders   DG Bone Density   Smoker    Discussed that using an e-cigarette to quit smoking is very difficult because a big portion of the addition is the habit of the hand to mouth smoking activity Discussed that she should try to break out of the habit        Other Visit Diagnoses    Encounter for medication monitoring       Cough       Abnormal urine odor       Relevant Orders   POCT urinalysis dipstick (Completed)     A total of 40 minutes were spent face-to-face with the patient during this encounter and over half of that time was spent on counseling and coordination of care.   [place]

## 2016-11-05 NOTE — Patient Instructions (Addendum)
  Aspercreme with Lidocaine 4% Apply to muscles that have spasms   IF you received an x-ray today, you will receive an invoice from [facility]. Please contact [facility] at [phone] with questions or concerns regarding your invoice.   IF you received labwork today, you will receive an invoice from [place]. Please contact [facility] at [phone] with questions or concerns regarding your invoice.   Our billing staff will not be able to assist you with questions regarding bills from these companies.  You will be contacted with the lab results as soon as they are available. The fastest way to get your results is to activate your My Chart account. Instructions are located on the last page of this paperwork. If you have not heard from [place] regarding the results in 2 weeks, please contact this office.    We recommend that you schedule a mammogram for breast cancer screening. Typically, you do not need a referral to do this. Please contact a local imaging center to schedule your mammogram.  [facility] - [phone]  *ask for the Radiology Department [facility]) - [phone] or [phone]  [facility] - [phone] [place] [phone] [facility] - [phone]  *ask for the [facility] - [phone]  *ask for the Radiology Department [facility] - [phone]  *ask for the [place] - [phone]

## 2016-11-06 DIAGNOSIS — Z780 Asymptomatic menopausal state: Secondary | ICD-10-CM | POA: Insufficient documentation

## 2016-11-06 DIAGNOSIS — F17210 Nicotine dependence, cigarettes, uncomplicated: Secondary | ICD-10-CM | POA: Insufficient documentation

## 2016-11-06 DIAGNOSIS — F321 Major depressive disorder, single episode, moderate: Secondary | ICD-10-CM | POA: Insufficient documentation

## 2016-11-06 DIAGNOSIS — G4734 Idiopathic sleep related nonobstructive alveolar hypoventilation: Secondary | ICD-10-CM | POA: Insufficient documentation

## 2016-11-06 DIAGNOSIS — F32A Depression, unspecified: Secondary | ICD-10-CM | POA: Insufficient documentation

## 2016-11-06 NOTE — Assessment & Plan Note (Signed)
Will send for sleep study for cpap titration Discussed that she is on many medications with overlapping side effects of drowsiness.  She will not get flexeril refills so this was discontinued.  Discussed that the plan will be to rely on the lyrica and amitriptyline for sleep and less on the [place]

## 2016-11-06 NOTE — Assessment & Plan Note (Signed)
Discussed that using an e-cigarette to quit smoking is very difficult because a big portion of the addition is the habit of the hand to mouth smoking activity Discussed that she should try to break out of the habit

## 2016-11-06 NOTE — Assessment & Plan Note (Signed)
Discussed the importance of getting proper treatment for sleep apnea Discussed that if left untreated it can lead to worsening insomnia and creates an increased risk for complications when benzos are being used.  Also discussed that it can lead to hypertension and right heart strain. Referral placed for cpap titration.  Pt will need new supplies.

## 2016-11-06 NOTE — Assessment & Plan Note (Signed)
Patient has not had screening for bone density disorder.  Will refer for bone density scan.  Discussed with patient that she is a postmenopausal Caucasian female with a long history of smoking so she should be screened.  She agreed to screening. Referrals were placed.

## 2016-11-06 NOTE — Assessment & Plan Note (Signed)
Patient to continue her current bp medications.  Her previous bp levels were low for her age.  At this time there is no steady baseline. Will leave her at her current dose and recheck her bp closely.

## 2016-11-14 ENCOUNTER — Telehealth: Payer: Self-pay | Admitting: Family Medicine

## 2016-11-14 DIAGNOSIS — G4733 Obstructive sleep apnea (adult) (pediatric): Secondary | ICD-10-CM

## 2016-11-14 NOTE — Telephone Encounter (Signed)
Order placed

## 2016-12-06 ENCOUNTER — Telehealth: Payer: Self-pay

## 2016-12-06 NOTE — Telephone Encounter (Signed)
Need pa [id number] covermymeds

## 2016-12-10 ENCOUNTER — Ambulatory Visit: Payer: Medicare HMO | Admitting: Family Medicine

## 2016-12-10 NOTE — Telephone Encounter (Signed)
Request submitted to cover my meds.  They requested a 5 day period of review.  I will recheck in 5 business days.

## 2016-12-25 NOTE — Telephone Encounter (Signed)
Called cover my meds. Med is approved until 08/17/2017.  Patient aware. She requests a refill on her levothyroxine as well.

## 2017-01-01 ENCOUNTER — Ambulatory Visit (HOSPITAL_BASED_OUTPATIENT_CLINIC_OR_DEPARTMENT_OTHER): Payer: Medicare HMO | Attending: Family Medicine | Admitting: Internal Medicine

## 2017-01-01 DIAGNOSIS — G4733 Obstructive sleep apnea (adult) (pediatric): Secondary | ICD-10-CM

## 2017-01-01 DIAGNOSIS — R0902 Hypoxemia: Secondary | ICD-10-CM | POA: Diagnosis not present

## 2017-01-01 DIAGNOSIS — R0683 Snoring: Secondary | ICD-10-CM | POA: Diagnosis not present

## 2017-01-01 MED ORDER — LEVOTHYROXINE SODIUM 75 MCG PO TABS: 75.0000 ug | ORAL_TABLET | Freq: Every day | ORAL | 0 refills | Status: DC

## 2017-01-01 NOTE — Telephone Encounter (Signed)
Let her know that she can get 3 months of levothyroxine but she will need an appointment before any additional refills after that so it would be best to make an appointment by August so that she does not run out

## 2017-01-04 NOTE — Procedures (Signed)
  Patient Name: Bonnie Lawson, Revak Date: 01/01/2017 Gender: Female D.O.B: 08-17-1946 Age (years): 14 Referring Provider: Gwendolyn Fill A Stallings Height (inches): 63 Interpreting Physician: Baird Lyons MD, ABSM Weight (lbs): 194 RPSGT: Baxter Flattery BMI: 35 MRN: [id number] Neck Size: 15.00 CLINICAL INFORMATION Sleep Study Type: NPSG  Indication for sleep study: Obesity, OSA, Snoring  Epworth Sleepiness Score: 2  SLEEP STUDY TECHNIQUE As per the AASM Manual for the Scoring of Sleep and Associated Events v2.3 (April 2016) with a hypopnea requiring 4% desaturations.  The channels recorded and monitored were frontal, central and occipital EEG, electrooculogram (EOG), submentalis EMG (chin), nasal and oral airflow, thoracic and abdominal wall motion, anterior tibialis EMG, snore microphone, electrocardiogram, and pulse oximetry.  MEDICATIONS Medications self-administered by patient taken the night of the study : none reported  SLEEP ARCHITECTURE The study was initiated at 10:43:48 PM and ended at 4:55:00 AM.  Sleep onset time was 10.7 minutes and the sleep efficiency was 94.3%. The total sleep time was 350.0 minutes.  Stage REM latency was 237.5 minutes.  The patient spent 3.43% of the night in stage N1 sleep, 96.29% in stage N2 sleep, 0.00% in stage N3 and 0.29% in REM.  Alpha intrusion was absent.  Supine sleep was 10.93%.  RESPIRATORY PARAMETERS The overall apnea/hypopnea index (AHI) was 3.4 per hour. There were 14 total apneas, including 14 obstructive, 0 central and 0 mixed apneas. There were 6 hypopneas and 0 RERAs.  The AHI during Stage REM sleep was 0.0 per hour.  AHI while supine was 22.0 per hour.  The mean oxygen saturation was 89.33%. The minimum SpO2 during sleep was 81.00%.  Loud snoring was noted during this study.  CARDIAC DATA The 2 lead EKG demonstrated sinus rhythm. The mean heart rate was 76.79 beats per minute. Other EKG findings include:  None.  LEG MOVEMENT DATA The total PLMS were 0 with a resulting PLMS index of 0.00. Associated arousal with leg movement index was 0.0 .  IMPRESSIONS - No significant obstructive sleep apnea occurred during this study (AHI = 3.4/h). - No significant central sleep apnea occurred during this study (CAI = 0.0/h). - Moderate oxygen desaturation was noted during this study (Min O2 = 81.00%. Mean 89.3%). - The patient snored with Loud snoring volume. - No cardiac abnormalities were noted during this study. - Clinically significant periodic limb movements did not occur during sleep. No significant associated arousals.  DIAGNOSIS - Nocturnal Hypoxemia (327.26 [G47.36 ICD-10]) - Primary Snoring (786.09 [R06.83 ICD-10])  RECOMMENDATIONS - Assess hypoxemia. Patient may be candidate for supplemental O2 during sleep. - Positional therapy avoiding supine position during sleep. - Avoid alcohol, sedatives and other CNS depressants that may worsen sleep apnea and disrupt normal sleep architecture. - Sleep hygiene should be reviewed to assess factors that may improve sleep quality. - Weight management and regular exercise should be initiated or continued if appropriate.  [Electronically signed] 01/04/2017 10:40 AM  Baird Lyons MD, ABSM Diplomate, American Board of Sleep Medicine   NPI: [id number]  [place], American Board of Sleep Medicine  ELECTRONICALLY SIGNED ON:  01/04/2017, 10:37 AM [place] PH: [phone]   FX: [phone] [place]

## 2017-01-06 ENCOUNTER — Ambulatory Visit (INDEPENDENT_AMBULATORY_CARE_PROVIDER_SITE_OTHER): Payer: Medicare HMO | Admitting: Family Medicine

## 2017-01-06 ENCOUNTER — Encounter: Payer: Self-pay | Admitting: Family Medicine

## 2017-01-06 ENCOUNTER — Ambulatory Visit (INDEPENDENT_AMBULATORY_CARE_PROVIDER_SITE_OTHER): Payer: Medicare HMO

## 2017-01-06 VITALS — BP 121/75 | HR 111 | Temp 98.8°F | Resp 17 | Ht 62.5 in | Wt 192.0 lb

## 2017-01-06 DIAGNOSIS — M19012 Primary osteoarthritis, left shoulder: Secondary | ICD-10-CM | POA: Diagnosis not present

## 2017-01-06 DIAGNOSIS — M7502 Adhesive capsulitis of left shoulder: Secondary | ICD-10-CM

## 2017-01-06 DIAGNOSIS — G4734 Idiopathic sleep related nonobstructive alveolar hypoventilation: Secondary | ICD-10-CM

## 2017-01-06 DIAGNOSIS — I10 Essential (primary) hypertension: Secondary | ICD-10-CM | POA: Diagnosis not present

## 2017-01-06 DIAGNOSIS — F5101 Primary insomnia: Secondary | ICD-10-CM | POA: Diagnosis not present

## 2017-01-06 DIAGNOSIS — R69 Illness, unspecified: Secondary | ICD-10-CM | POA: Diagnosis not present

## 2017-01-06 MED ORDER — PULSE OXIMETER MISC: 1.0000 | Freq: Every evening | 0 refills | Status: DC

## 2017-01-06 NOTE — Patient Instructions (Addendum)
IF you received an x-ray today, you will receive an invoice from [facility]. Please contact [facility] at [phone] with questions or concerns regarding your invoice.   IF you received labwork today, you will receive an invoice from [place]. Please contact [facility] at [phone] with questions or concerns regarding your invoice.   Our billing staff will not be able to assist you with questions regarding bills from these companies.  You will be contacted with the lab results as soon as they are available. The fastest way to get your results is to activate your My Chart account. Instructions are located on the last page of this paperwork. If you have not heard from [place] regarding the results in 2 weeks, please contact this office.      Shoulder Range of Motion Exercises Shoulder range of motion (ROM) exercises are designed to keep the shoulder moving freely. They are often recommended for people who have shoulder pain. Phase 1 exercises When you are able, do this exercise 5-6 days per week, or as told by your health care provider. Work toward doing 2 sets of 10 swings. Pendulum Exercise  How To Do This Exercise Lying Down 1. Lie face-down on a bed with your abdomen close to the side of the bed. 2. Let your arm hang over the side of the bed. 3. Relax your shoulder, arm, and hand. 4. Slowly and gently swing your arm forward and back. Do not use your neck muscles to swing your arm. They should be relaxed. If you are struggling to swing your arm, have someone gently swing it for you. When you do this exercise for the first time, swing your arm at a 15 degree angle for 15 seconds, or swing your arm 10 times. As pain lessens over time, increase the angle of the swing to 30-45 degrees. 5. Repeat steps 1-4 with the other arm. How To Do This Exercise While Standing 1. Stand next to a sturdy chair or table and hold on to it with your hand. 1. Bend forward at the  waist. 2. Bend your knees slightly. 3. Relax your other arm and let it hang limp. 4. Relax the shoulder blade of the arm that is hanging and let it drop. 5. While keeping your shoulder relaxed, use body motion to swing your arm in small circles. The first time you do this exercise, swing your arm for about 30 seconds or 10 times. When you do it next time, swing your arm for a little longer. 6. Stand up tall and relax. 7. Repeat steps 1-7, this time changing the direction of the circles. 2. Repeat steps 1-8 with the other arm. Phase 2 exercises Do these exercises 3-4 times per day on 5-6 days per week or as told by your health care provider. Work toward holding the stretch for 20 seconds. Stretching Exercise 1  1. Lift your arm straight out in front of you. 2. Bend your arm 90 degrees at the elbow (right angle) so your forearm goes across your body and looks like the letter "L." 3. Use your other arm to gently pull the elbow forward and across your body. 4. Repeat steps 1-3 with the other arm. Stretching Exercise 2  You will need a towel or rope for this exercise. 1. Bend one arm behind your back with the palm facing outward. 2. Hold a towel with your other hand. 3. Reach the arm that holds the towel above your head, and bend that arm  at the elbow. Your wrist should be behind your neck. 4. Use your free hand to grab the free end of the towel. 5. With the higher hand, gently pull the towel up behind you. 6. With the lower hand, pull the towel down behind you. 7. Repeat steps 1-6 with the other arm. Phase 3 exercises Do each of these exercises at four different times of day (sessions) every day or as told by your health care provider. To begin with, repeat each exercise 5 times (repetitions). Work toward doing 3 sets of 12 repetitions or as told by your health care provider. Strengthening Exercise 1  You will need a light weight for this activity. As you grow stronger, you may use a heavier  weight. 1. Standing with a weight in your hand, lift your arm straight out to the side until it is at the same height as your shoulder. 2. Bend your arm at 90 degrees so that your fingers are pointing to the ceiling. 3. Slowly raise your hand until your arm is straight up in the air. 4. Repeat steps 1-3 with the other arm. Strengthening Exercise 2  You will need a light weight for this activity. As you grow stronger, you may use a heavier weight. 1. Standing with a weight in your hand, gradually move your straight arm in an arc, starting at your side, then out in front of you, then straight up over your head. 2. Gradually move your other arm in an arc, starting at your side, then out in front of you, then straight up over your head. 3. Repeat steps 1-2 with the other arm. Strengthening Exercise 3  You will need an elastic band for this activity. As you grow stronger, gradually increase the size of the bands or increase the number of bands that you use at one time. 1. While standing, hold an elastic band in one hand and raise that arm up in the air. 2. With your other hand, pull down the band until that hand is by your side. 3. Repeat steps 1-2 with the other arm. This information is not intended to replace advice given to you by your health care provider. Make sure you discuss any questions you have with your health care provider. Document Released: 05/03/2003 Document Revised: 03/30/2016 Document Reviewed: 07/31/2014 Elsevier Interactive Patient Education  2017 [employer].

## 2017-01-06 NOTE — Progress Notes (Signed)
Chief Complaint  Patient presents with  . Arm Pain    left     HPI   Pt reports that she has left arm pain that is from her shoulder to her elbow  Onset about 1.5 months ago States that she was using her elbow to help prop herself up as she was turning in a chair to look behind her on the left side She states that she heard and felt a pop with a shooting pain Pain is 9/10 She took tylenol for pain Aggravated by  Use, movement She is not getting any relief and has a hard time sleeping She notices that she is losing strength She denies every having an injury to the left shoulder She denies headaches or neck pain She denies elbow or wrist pain She states that when she moves her wrist she feels a pulling sensation in her upper arm pain  Nocturnal Hypoxia She had a sleep study for primary insomnia last week She states that she does not know the result but struggles with sleep and now the pain is making things worse   Essential hypertension  Blood pressure meds being taken She denies lower extremity edema She denies chest pains  No past medical history on file.  Current Outpatient Prescriptions  Medication Sig Dispense Refill  . amitriptyline (ELAVIL) 50 MG tablet Take 50 mg by mouth at bedtime.    Marland Kitchen aspirin EC 81 MG tablet Take 81 mg by mouth daily.    . fluticasone (FLONASE) 50 MCG/ACT nasal spray Place 2 sprays into both nostrils daily. 16 g 6  . HYDROcodone-acetaminophen (NORCO/VICODIN) 5-325 MG tablet Take 1 tablet by mouth every 6 (six) hours as needed for moderate pain.    Marland Kitchen levothyroxine (SYNTHROID, LEVOTHROID) 75 MCG tablet Take 1 tablet (75 mcg total) by mouth daily before breakfast. With no other meds 90 tablet 0  . lisinopril-hydrochlorothiazide (PRINZIDE,ZESTORETIC) 10-12.5 MG tablet TAKE 2 TABLETS BY MOUTH DAILY 180 tablet 0  . omeprazole (PRILOSEC) 40 MG capsule TAKE 1 CAPSULE(40 MG) BY MOUTH DAILY 90 capsule 0  . pregabalin (LYRICA) 150 MG capsule Take 1  capsule (150 mg total) by mouth 2 (two) times daily. 180 capsule 1  . traMADol (ULTRAM) 50 MG tablet Take 1 tablet (50 mg total) by mouth every 8 (eight) hours as needed. 30 tablet 0  . venlafaxine XR (EFFEXOR-XR) 150 MG 24 hr capsule Take 1 capsule (150 mg total) by mouth 2 (two) times daily with a meal. 60 capsule 6  . zolpidem (AMBIEN) 10 MG tablet Take 1 tablet (10 mg total) by mouth at bedtime as needed for sleep. 30 tablet 0  . Misc. Devices (PULSE OXIMETER) MISC 1 Device by Does not apply route Nightly. Apply nightly as needed 1 each 0   No current facility-administered medications for this visit.     Allergies:  Allergies  Allergen Reactions  . Sulfa Antibiotics     No past surgical history on file.  Social History   Social History  . Marital status: Divorced    Spouse name: N/A  . Number of children: N/A  . Years of education: N/A   Social History Main Topics  . Smoking status: Current Some Day Smoker    Packs/day: 0.75    Years: 37.00  . Smokeless tobacco: Current User  . Alcohol use No  . Drug use: No  . Sexual activity: Not Asked   Other Topics Concern  . None   Social History Narrative  .  None    ROS See hpi  Objective: Vitals:   01/06/17 1149  BP: 121/75  Pulse: (!) 111  Resp: 17  Temp: 98.8 F (37.1 C)  TempSrc: Oral  SpO2: 98%  Weight: 192 lb (87.1 kg)  Height: 5' 2.5" (1.588 m)    Physical Exam  Constitutional: She is oriented to person, place, and time. She appears well-developed and well-nourished.  HENT:  Head: Normocephalic and atraumatic.  Eyes: Conjunctivae and EOM are normal.  Cardiovascular: Normal rate, regular rhythm and normal heart sounds.   Pulmonary/Chest: Effort normal and breath sounds normal. No respiratory distress. She has no wheezes.  Musculoskeletal:       Right shoulder: She exhibits normal range of motion, no tenderness, no bony tenderness, no spasm and normal pulse.       Left shoulder: She exhibits decreased  range of motion and tenderness. She exhibits no bony tenderness, no swelling, no effusion, no crepitus, no deformity, no laceration, no pain, no spasm, normal pulse and normal strength.  Neurological: She is alert and oriented to person, place, and time.   Shoulder Xray independent review of xray with pt  Showing no fracture or dislocation Some mild degenerative changes   Assessment and Plan Bonnie Lawson was seen today for arm pain.  Diagnoses and all orders for this visit:  Adhesive capsulitis of left shoulder-  Advised physical therapy follow up Discussed pathology of disease -     DG Shoulder Left -     Ambulatory referral to Physical Therapy  Primary insomnia Nocturnal hypoxia -  Prescription faxed to [facility] -     For home use only DME oxygen -     Misc. Devices (PULSE OXIMETER) MISC; 1 Device by Does not apply route Nightly. Apply nightly as needed  Essential Hypertension- discussed that untreated hypoxia can cause bp changes  A total of 25 minutes were spent face-to-face with the patient during this encounter and over half of that time was spent on counseling and coordination of care.   [place]

## 2017-01-07 ENCOUNTER — Telehealth: Payer: Self-pay | Admitting: Family Medicine

## 2017-01-07 DIAGNOSIS — G4734 Idiopathic sleep related nonobstructive alveolar hypoventilation: Secondary | ICD-10-CM

## 2017-01-07 DIAGNOSIS — R0902 Hypoxemia: Secondary | ICD-10-CM

## 2017-01-07 NOTE — Telephone Encounter (Signed)
THIS MESSAGE IS FROM JASON AT [facility]: HE JUST RECEIVED A FAX THIS MORNING (01/07/17) REQUESTING PATIENT TO HAVE OXYGEN. HE WOULD LIKE TO DISCUSS WITH DR. Nolon Rod TO SAY THAT THE PATIENT WILL HAVE A HARD TIME TO QUALIFY FOR OXYGEN DUE TO BARRIERS FROM HER [employer]. BEST PHONE [phone] [address]T. 63 ([place]) [place]

## 2017-01-08 NOTE — Telephone Encounter (Signed)
This patient needs home oxygen for nocturnal hypoxia.  How do I order this?  Can ou find out from [facility]? Or if you have any resources that I should be using.

## 2017-01-09 DIAGNOSIS — R531 Weakness: Secondary | ICD-10-CM | POA: Diagnosis not present

## 2017-01-09 DIAGNOSIS — Z6834 Body mass index (BMI) 34.0-34.9, adult: Secondary | ICD-10-CM | POA: Diagnosis not present

## 2017-01-09 DIAGNOSIS — Z973 Presence of spectacles and contact lenses: Secondary | ICD-10-CM | POA: Diagnosis not present

## 2017-01-09 DIAGNOSIS — I10 Essential (primary) hypertension: Secondary | ICD-10-CM | POA: Diagnosis not present

## 2017-01-09 DIAGNOSIS — R69 Illness, unspecified: Secondary | ICD-10-CM | POA: Diagnosis not present

## 2017-01-09 DIAGNOSIS — Z79899 Other long term (current) drug therapy: Secondary | ICD-10-CM | POA: Diagnosis not present

## 2017-01-09 DIAGNOSIS — G629 Polyneuropathy, unspecified: Secondary | ICD-10-CM | POA: Diagnosis not present

## 2017-01-09 DIAGNOSIS — G4700 Insomnia, unspecified: Secondary | ICD-10-CM | POA: Diagnosis not present

## 2017-01-09 DIAGNOSIS — Z0000 Encounter for general adult medical examination without abnormal findings: Secondary | ICD-10-CM | POA: Diagnosis not present

## 2017-01-09 DIAGNOSIS — Z7982 Long term (current) use of aspirin: Secondary | ICD-10-CM | POA: Diagnosis not present

## 2017-01-09 DIAGNOSIS — H9110 Presbycusis, unspecified ear: Secondary | ICD-10-CM | POA: Diagnosis not present

## 2017-01-09 DIAGNOSIS — E039 Hypothyroidism, unspecified: Secondary | ICD-10-CM | POA: Diagnosis not present

## 2017-01-09 DIAGNOSIS — J309 Allergic rhinitis, unspecified: Secondary | ICD-10-CM | POA: Diagnosis not present

## 2017-01-09 DIAGNOSIS — E669 Obesity, unspecified: Secondary | ICD-10-CM | POA: Diagnosis not present

## 2017-01-09 DIAGNOSIS — M7502 Adhesive capsulitis of left shoulder: Secondary | ICD-10-CM | POA: Diagnosis not present

## 2017-01-13 NOTE — Telephone Encounter (Signed)
na

## 2017-01-13 NOTE — Telephone Encounter (Signed)
N/a due to high call volumes?  Order for o2 in nurse box, ready to be sent when we can get a hold of them

## 2017-01-14 ENCOUNTER — Telehealth: Payer: Self-pay

## 2017-01-14 DIAGNOSIS — G4734 Idiopathic sleep related nonobstructive alveolar hypoventilation: Secondary | ICD-10-CM

## 2017-01-14 NOTE — Telephone Encounter (Signed)
Insurance will only cover venlafaxine qd, not bid  Please send new rx if appropriate  [place] [phone]

## 2017-01-16 NOTE — Telephone Encounter (Signed)
Looks like sleep study shows no sleep apnea per home health, may remove this dx from pt list (under media)   Faxed 2 lit nighttime o2 order to home health  [phone] fax

## 2017-01-16 NOTE — Telephone Encounter (Signed)
lmtcb with fax #

## 2017-02-04 ENCOUNTER — Encounter: Payer: Self-pay | Admitting: Family Medicine

## 2017-02-04 ENCOUNTER — Ambulatory Visit (INDEPENDENT_AMBULATORY_CARE_PROVIDER_SITE_OTHER): Payer: Medicare HMO | Admitting: Family Medicine

## 2017-02-04 VITALS — BP 139/90 | HR 84 | Temp 98.0°F | Resp 18 | Ht 62.52 in | Wt 189.2 lb

## 2017-02-04 DIAGNOSIS — Z0000 Encounter for general adult medical examination without abnormal findings: Secondary | ICD-10-CM

## 2017-02-04 DIAGNOSIS — Z1231 Encounter for screening mammogram for malignant neoplasm of breast: Secondary | ICD-10-CM

## 2017-02-04 DIAGNOSIS — M7502 Adhesive capsulitis of left shoulder: Secondary | ICD-10-CM | POA: Diagnosis not present

## 2017-02-04 DIAGNOSIS — I10 Essential (primary) hypertension: Secondary | ICD-10-CM

## 2017-02-04 DIAGNOSIS — F17200 Nicotine dependence, unspecified, uncomplicated: Secondary | ICD-10-CM | POA: Diagnosis not present

## 2017-02-04 DIAGNOSIS — G4734 Idiopathic sleep related nonobstructive alveolar hypoventilation: Secondary | ICD-10-CM

## 2017-02-04 DIAGNOSIS — Z1239 Encounter for other screening for malignant neoplasm of breast: Secondary | ICD-10-CM

## 2017-02-04 DIAGNOSIS — Z1159 Encounter for screening for other viral diseases: Secondary | ICD-10-CM | POA: Diagnosis not present

## 2017-02-04 DIAGNOSIS — R69 Illness, unspecified: Secondary | ICD-10-CM | POA: Diagnosis not present

## 2017-02-04 NOTE — Progress Notes (Signed)
QUICK REFERENCE INFORMATION: The ABCs of Providing the Annual Wellness Visit  CMS.gov Medicare Learning Network  [employer] Annual Wellness Visit  Subjective:   Bonnie Lawson is a 71 y.o. Female who presents for an Annual Wellness Visit.   Nocturnal hypoxemia Sleep study in 5/18 Did not show sleep apnea but showed nocturnal hypoxemia Pt has not been set up with home oxygen as yet Order was faxed to home health June 1st Pt continues to snore at night She no longer uses ambien No alcohol use at bedtime     Patient Active Problem List   Diagnosis Date Noted  . Nocturnal hypoxemia 11/06/2016  . Moderate depressive episode ([place]) 11/06/2016  . Menopause 11/06/2016  . Smoker 11/06/2016  . Uncontrolled hypertension 11/05/2016  . Abdominal pain, chronic, epigastric 04/25/2016  . Abdominal bloating 04/25/2016  . Leukocytosis 04/25/2016  . Insomnia 04/25/2016  . Essential hypertension 04/25/2016  . Chronic pain 04/25/2016    Past Medical History:  Diagnosis Date  . Allergy      History reviewed. No pertinent surgical history.   Outpatient Medications Prior to Visit  Medication Sig Dispense Refill  . amitriptyline (ELAVIL) 50 MG tablet Take 50 mg by mouth at bedtime.    Marland Kitchen aspirin EC 81 MG tablet Take 81 mg by mouth daily.    . fluticasone (FLONASE) 50 MCG/ACT nasal spray Place 2 sprays into both nostrils daily. 16 g 6  . HYDROcodone-acetaminophen (NORCO/VICODIN) 5-325 MG tablet Take 1 tablet by mouth every 6 (six) hours as needed for moderate pain.    Marland Kitchen levothyroxine (SYNTHROID, LEVOTHROID) 75 MCG tablet Take 1 tablet (75 mcg total) by mouth daily before breakfast. With no other meds 90 tablet 0  . lisinopril-hydrochlorothiazide (PRINZIDE,ZESTORETIC) 10-12.5 MG tablet TAKE 2 TABLETS BY MOUTH DAILY 180 tablet 0  . Misc. Devices (PULSE OXIMETER) MISC 1 Device by Does not apply route Nightly. Apply nightly as needed 1 each 0  . omeprazole (PRILOSEC) 40 MG capsule TAKE 1  CAPSULE(40 MG) BY MOUTH DAILY 90 capsule 0  . traMADol (ULTRAM) 50 MG tablet Take 1 tablet (50 mg total) by mouth every 8 (eight) hours as needed. 30 tablet 0  . venlafaxine XR (EFFEXOR-XR) 150 MG 24 hr capsule Take 1 capsule (150 mg total) by mouth 2 (two) times daily with a meal. 60 capsule 6  . pregabalin (LYRICA) 150 MG capsule Take 1 capsule (150 mg total) by mouth 2 (two) times daily. 180 capsule 1  . zolpidem (AMBIEN) 10 MG tablet Take 1 tablet (10 mg total) by mouth at bedtime as needed for sleep. 30 tablet 0   No facility-administered medications prior to visit.     Allergies  Allergen Reactions  . Sulfa Antibiotics      History reviewed. No pertinent family history.   Social History   Social History  . Marital status: Divorced    Spouse name: N/A  . Number of children: N/A  . Years of education: N/A   Social History Main Topics  . Smoking status: Current Some Day Smoker    Packs/day: 0.75    Years: 37.00  . Smokeless tobacco: Current User  . Alcohol use No  . Drug use: No  . Sexual activity: Not Asked   Other Topics Concern  . None   Social History Narrative  . None    Recent Hospitalizations? no  Health Habits: Current exercise activities include: none Exercise: 0 times/week. Diet: in general, a "healthy" diet    Alcohol intake: none  Health Risk Assessment: The patient has completed a Health Risk Assessment. This has been reveiwed with them and has been scanned into the [facility] system as an attached document.  Current Medical Providers and Suppliers: [facility] Patient Care Team: Forrest Moron, MD as PCP - General (Internal Medicine) No future appointments.   Age-appropriate Screening Schedule: The list below includes current immunization status and future screening recommendations based on patient's age. Orders for these recommended tests are listed in the plan section. The patient has been provided with a written plan. Immunization History    Administered Date(s) Administered  . Influenza,inj,Quad PF,36+ Mos 04/22/2016  . Influenza-Unspecified 04/18/2016  . Pneumococcal Conjugate-13 04/26/2016    Health Maintenance reviewed   Depression Screen-PHQ2/9 completed today  Depression screen [facility] 2/9 02/04/2017 02/04/2017 01/06/2017 11/05/2016 09/05/2016  Decreased Interest 1 0 0 1 1  Down, Depressed, Hopeless 1 0 0 1 3  PHQ - 2 Score 2 0 0 2 4  Altered sleeping 0 - - 1 3  Tired, decreased energy 3 - - 3 1  Change in appetite 0 - - 1 0  Feeling bad or failure about yourself  1 - - 2 3  Trouble concentrating 2 - - 3 3  Moving slowly or fidgety/restless 1 - - 2 0  Suicidal thoughts 0 - - 0 0  PHQ-9 Score 9 - - 14 14  Difficult doing work/chores Somewhat difficult - - - Somewhat difficult    Depression Severity and Treatment Recommendations:  0-4= None  5-9= Mild / Treatment: Support, educate to call if worse; return in one month  10-14= Moderate / Treatment: Support, watchful waiting; Antidepressant or Psycotherapy  15-19= Moderately severe / Treatment: Antidepressant OR Psychotherapy  >= 20 = Major depression, severe / Antidepressant AND Psychotherapy  Functional Status Survey: Is the patient deaf or have difficulty hearing?: No Does the patient have difficulty seeing, even when wearing glasses/contacts?: Yes Does the patient have difficulty concentrating, remembering, or making decisions?: Yes Does the patient have difficulty walking or climbing stairs?: Yes Does the patient have difficulty dressing or bathing?: No Does the patient have difficulty doing errands alone such as visiting a doctor's office or shopping?: No   Advanced Care Planning: 1. Patient has executed an Advance Directive: no  2. If no, patient was given the opportunity to execute an Advance Directive today? yes  3. Are the patient's advanced directives in [place]? no 4. This patient has the ability to prepare an Advance Directive: yes 5. Provider is  willing to follow the patient's wishes: yes  Review of Systems  Constitutional: Negative for chills and fever.  Eyes: Negative for blurred vision and double vision.  Cardiovascular: Negative for chest pain.  Gastrointestinal: Negative for abdominal pain, nausea and vomiting.  Skin: Negative for itching and rash.  Neurological: Negative for dizziness and headaches.      Objective:   Vitals:   02/04/17 0924  BP: 139/90  Pulse: 84  Resp: 18  Temp: 98 F (36.7 C)  TempSrc: Oral  SpO2: 97%  Weight: 189 lb 3.2 oz (85.8 kg)  Height: 5' 2.52" (1.588 m)    Body mass index is 34.03 kg/m.   Physical Exam  Constitutional: She is oriented to person, place, and time. She appears well-developed and well-nourished.  HENT:  Head: Normocephalic and atraumatic.  Right Ear: External ear normal.  Left Ear: External ear normal.  Nose: Nose normal.  Mouth/Throat: Oropharynx is clear and moist.  Cardiovascular: Normal rate, regular rhythm and  normal heart sounds.   Pulmonary/Chest: Effort normal and breath sounds normal. No respiratory distress. She has no wheezes. Right breast exhibits no inverted nipple, no mass, no nipple discharge, no skin change and no tenderness. Left breast exhibits no inverted nipple, no mass, no nipple discharge, no skin change and no tenderness. Breasts are symmetrical.    Neurological: She is alert and oriented to person, place, and time.  Musculoskeletal: left shoulder with decreased range of motion, no crepitus or effusion, worse with external rotation   Assessment/Plan:    During the course of the visit the patient was educated and counseled about appropriate screening and preventive services including:  See orders  Body mass index is 34.03 kg/m. Discussed the patient's BMI with her. The BMI BMI is in the acceptable range  Denene was seen today for annual exam.  Diagnoses and all orders for this visit:  Annual wellness exam- advised pt  Nocturnal  hypoxemia- gave information for mail order for home oxygen supplies  Screening for breast cancer -     [facility]  Need for hepatitis C screening test -     HCV Ab w/Rflx to Verification  Essential hypertension - bp in good range -     Lipid panel -     Comprehensive metabolic panel  Smoker- smoking cessation advise, pt in precontemplation  Adhesive capsulitis of left shoulder- advised PT to help improve range of motion -     Ambulatory referral to Physical Therapy      No Follow-up on file.  No future appointments.  Patient Instructions       IF you received an x-ray today, you will receive an invoice from [facility]. Please contact [facility] at [phone] with questions or concerns regarding your invoice.   IF you received labwork today, you will receive an invoice from [facility]. Please contact [facility] at [phone] with questions or concerns regarding your invoice.   Our billing staff will not be able to assist you with questions regarding bills from these companies.  You will be contacted with the lab results as soon as they are available. The fastest way to get your results is to activate your My Chart account. Instructions are located on the last page of this paperwork. If you have not heard from [place] regarding the results in 2 weeks, please contact this office.     Call to set up a mammogram and bone density  We recommend that you schedule a mammogram for breast cancer screening. Typically, you do not need a referral to do this. Please contact a local imaging center to schedule your mammogram.  [facility] - [phone]  *ask for the Radiology Department [facility] ([place]) - [phone] or [phone]  [facility] - [phone] [place] [phone] [facility] - [phone]  *ask for the [facility] - [phone]  *ask for the Radiology Department [facility] - [phone]  *ask for the [facility] - [phone] Smoking Tobacco Information Smoking tobacco will very likely harm your health. Tobacco contains a poisonous (toxic), colorless chemical called nicotine. Nicotine affects the brain and makes tobacco addictive. This change in your brain can make it hard to stop smoking. Tobacco also has other toxic chemicals that can hurt your body and raise your risk of many cancers. How can smoking tobacco affect me? Smoking tobacco can increase your chances of having  serious health conditions, such as:  Cancer. Smoking is most commonly associated with lung cancer, but can lead to cancer in other parts of the body.  Chronic obstructive pulmonary disease (COPD). This is a long-term lung condition that makes it hard to breathe. It also gets worse over time.  High blood pressure (hypertension), heart disease, stroke, or heart attack.  Lung infections, such as pneumonia.  Cataracts. This is when the lenses in the eyes become clouded.  Digestive problems. This may include peptic ulcers, heartburn, and gastroesophageal reflux disease (GERD).  Oral health problems, such as gum disease and tooth loss.  Loss of taste and smell.  Smoking can affect your appearance by causing:  Wrinkles.  Yellow or stained teeth, fingers, and fingernails.  Smoking tobacco can also affect your social life.  Many workplaces, [employer], hotels, and public places are tobacco-free. This means that you may experience challenges in finding places to smoke when away from home.  The cost of a smoking habit can be expensive. Expenses for someone who smokes come in two ways: ? You spend money on a regular basis to buy tobacco. ? Your health care costs in the long-term are higher if you smoke.  Tobacco smoke can also affect the health of those around you. Children of smokers have  greater chances of: ? Sudden infant death syndrome (SIDS). ? Ear infections. ? Lung infections.  What lifestyle changes can be made?  Do not start smoking. Quit if you already do.  To quit smoking: ? Make a plan to quit smoking and commit yourself to it. Look for programs to help you and ask your health care provider for recommendations and ideas. ? Talk with your health care provider about using nicotine replacement medicines to help you quit. Medicine replacement medicines include gum, lozenges, patches, sprays, or pills. ? Do not replace cigarette smoking with electronic cigarettes, which are commonly called e-cigarettes. The safety of e-cigarettes is not known, and some may contain harmful chemicals. ? Avoid places, people, or situations that tempt you to smoke. ? If you try to quit but return to smoking, don't give up hope. It is very common for people to try a number of times before they fully succeed. When you feel ready again, give it another try.  Quitting smoking might affect the way you eat as well as your weight. Be prepared to monitor your eating habits. Get support in planning and following a healthy diet.  Ask your health care provider about having regular tests (screenings) to check for cancer. This may include blood tests, imaging tests, and other tests.  Exercise regularly. Consider taking walks, joining a gym, or doing yoga or exercise classes.  Develop skills to manage your stress. These skills include meditation. What are the benefits of quitting smoking? By quitting smoking, you may:  Lower your risk of getting cancer and other diseases caused by smoking.  Live longer.  Breathe better.  Lower your blood pressure and heart rate.  Stop your addiction to tobacco.  Stop creating secondhand smoke that hurts other people.  Improve your sense of taste and smell.  Look better over time, due to having fewer wrinkles and less staining.  What can happen if changes  are not made? If you do not stop smoking, you may:  Get cancer and other diseases.  Develop COPD or other long-term (chronic) lung conditions.  Develop serious problems with your heart and blood vessels (cardiovascular system).  Need more tests to screen for problems caused  by smoking.  Have higher, long-term healthcare costs from medicines or treatments related to smoking.  Continue to have worsening changes in your lungs, mouth, and nose.  Where to find support: To get support to quit smoking, consider:  Asking your health care provider for more information and resources.  Taking classes to learn more about quitting smoking.  Looking for local organizations that offer resources about quitting smoking.  Joining a support group for people who want to quit smoking in your local community.  Where to find more information: You may find more information about quitting smoking from:  HelpGuide.org: www.helpguide.org/articles/addictions/how-to-quit-smoking.htm  https://hall.com/: smokefree.gov  American Lung Association: www.lung.org  Contact a health care provider if:  You have problems breathing.  Your lips, nose, or fingers turn blue.  You have chest pain.  You are coughing up blood.  You feel faint or you pass out.  You have other noticeable changes that cause you to worry. Summary  Smoking tobacco can negatively affect your health, the health of those around you, your finances, and your social life.  Do not start smoking. Quit if you already do. If you need help quitting, ask your health care provider.  Think about joining a support group for people who want to quit smoking in your local community. There are many effective programs that will help you to quit this behavior. This information is not intended to replace advice given to you by your health care provider. Make sure you discuss any questions you have with your health care provider. Document Released:  08/19/2016 Document Revised: 08/19/2016 Document Reviewed: 08/19/2016 Elsevier Interactive Patient Education  [employer].    An after visit summary with all of these plans was given to the patient.

## 2017-02-04 NOTE — Patient Instructions (Addendum)
IF you received an x-ray today, you will receive an invoice from [facility]. Please contact [facility] Radiology at [phone] with questions or concerns regarding your invoice.   IF you received labwork today, you will receive an invoice from [facility]. Please contact [facility] at [phone] with questions or concerns regarding your invoice.   Our billing staff will not be able to assist you with questions regarding bills from these companies.  You will be contacted with the lab results as soon as they are available. The fastest way to get your results is to activate your My Chart account. Instructions are located on the last page of this paperwork. If you have not heard from [place] regarding the results in 2 weeks, please contact this office.     Call to set up a mammogram and bone density  We recommend that you schedule a mammogram for breast cancer screening. Typically, you do not need a referral to do this. Please contact a local imaging center to schedule your mammogram.  [facility] - [phone]  *ask for the Radiology Department [facility]) - [phone] or [phone]  [facility] - [phone] [place] [phone] [facility] - [phone]  *ask for the [facility] - [phone]  *ask for the Radiology Department [facility] - [phone]  *ask for the [facility] - [phone] Smoking Tobacco Information Smoking tobacco will very likely harm your health. Tobacco contains a poisonous (toxic), colorless chemical called nicotine. Nicotine affects the brain and makes tobacco addictive. This change in your brain can make it hard to stop smoking. Tobacco also has other toxic chemicals that can hurt your body and raise your risk of many cancers. How can smoking tobacco affect me? Smoking tobacco can  increase your chances of having serious health conditions, such as:  Cancer. Smoking is most commonly associated with lung cancer, but can lead to cancer in other parts of the body.  Chronic obstructive pulmonary disease (COPD). This is a long-term lung condition that makes it hard to breathe. It also gets worse over time.  High blood pressure (hypertension), heart disease, stroke, or heart attack.  Lung infections, such as pneumonia.  Cataracts. This is when the lenses in the eyes become clouded.  Digestive problems. This may include peptic ulcers, heartburn, and gastroesophageal reflux disease (GERD).  Oral health problems, such as gum disease and tooth loss.  Loss of taste and smell.  Smoking can affect your appearance by causing:  Wrinkles.  Yellow or stained teeth, fingers, and fingernails.  Smoking tobacco can also affect your social life.  Many workplaces, [employer], hotels, and public places are tobacco-free. This means that you may experience challenges in finding places to smoke when away from home.  The cost of a smoking habit can be expensive. Expenses for someone who smokes come in two ways: ? You spend money on a regular basis to buy tobacco. ? Your health care costs in the long-term are higher if you smoke.  Tobacco smoke can also affect the health of those around you. Children of smokers have greater chances of: ? Sudden infant death syndrome (SIDS). ? Ear infections. ? Lung infections.  What lifestyle changes can be made?  Do not start smoking. Quit if you already do.  To quit smoking: ? Make a plan to quit smoking and commit yourself to it. Look for programs to  help you and ask your health care provider for recommendations and ideas. ? Talk with your health care provider about using nicotine replacement medicines to help you quit. Medicine replacement medicines include gum, lozenges, patches, sprays, or pills. ? Do not replace cigarette smoking with  electronic cigarettes, which are commonly called e-cigarettes. The safety of e-cigarettes is not known, and some may contain harmful chemicals. ? Avoid places, people, or situations that tempt you to smoke. ? If you try to quit but return to smoking, don't give up hope. It is very common for people to try a number of times before they fully succeed. When you feel ready again, give it another try.  Quitting smoking might affect the way you eat as well as your weight. Be prepared to monitor your eating habits. Get support in planning and following a healthy diet.  Ask your health care provider about having regular tests (screenings) to check for cancer. This may include blood tests, imaging tests, and other tests.  Exercise regularly. Consider taking walks, joining a gym, or doing yoga or exercise classes.  Develop skills to manage your stress. These skills include meditation. What are the benefits of quitting smoking? By quitting smoking, you may:  Lower your risk of getting cancer and other diseases caused by smoking.  Live longer.  Breathe better.  Lower your blood pressure and heart rate.  Stop your addiction to tobacco.  Stop creating secondhand smoke that hurts other people.  Improve your sense of taste and smell.  Look better over time, due to having fewer wrinkles and less staining.  What can happen if changes are not made? If you do not stop smoking, you may:  Get cancer and other diseases.  Develop COPD or other long-term (chronic) lung conditions.  Develop serious problems with your heart and blood vessels (cardiovascular system).  Need more tests to screen for problems caused by smoking.  Have higher, long-term healthcare costs from medicines or treatments related to smoking.  Continue to have worsening changes in your lungs, mouth, and nose.  Where to find support: To get support to quit smoking, consider:  Asking your health care provider for more  information and resources.  Taking classes to learn more about quitting smoking.  Looking for local organizations that offer resources about quitting smoking.  Joining a support group for people who want to quit smoking in your local community.  Where to find more information: You may find more information about quitting smoking from:  HelpGuide.org: www.helpguide.org/articles/addictions/how-to-quit-smoking.htm  https://hall.com/: smokefree.gov  American Lung Association: www.lung.org  Contact a health care provider if:  You have problems breathing.  Your lips, nose, or fingers turn blue.  You have chest pain.  You are coughing up blood.  You feel faint or you pass out.  You have other noticeable changes that cause you to worry. Summary  Smoking tobacco can negatively affect your health, the health of those around you, your finances, and your social life.  Do not start smoking. Quit if you already do. If you need help quitting, ask your health care provider.  Think about joining a support group for people who want to quit smoking in your local community. There are many effective programs that will help you to quit this behavior. This information is not intended to replace advice given to you by your health care provider. Make sure you discuss any questions you have with your health care provider. Document Released: 08/19/2016 Document Revised: 08/19/2016 Document Reviewed: 08/19/2016 Elsevier Interactive  Patient Education  [employer].

## 2017-02-05 LAB — LIPID PANEL
CHOL/HDL RATIO: 4.5 ratio — AB (ref 0.0–4.4)
Cholesterol, Total: 227 mg/dL — ABNORMAL HIGH (ref 100–199)
HDL: 51 mg/dL (ref >39)
LDL CALC: 136 mg/dL — AB (ref 0–99)
Triglycerides: 200 mg/dL — ABNORMAL HIGH (ref 0–149)
VLDL Cholesterol Cal: 40 mg/dL (ref 5–40)

## 2017-02-05 LAB — COMPREHENSIVE METABOLIC PANEL
ALBUMIN: 4.5 g/dL (ref 3.5–4.8)
ALK PHOS: 117 IU/L (ref 39–117)
ALT: 13 IU/L (ref 0–32)
AST: 21 IU/L (ref 0–40)
Albumin/Globulin Ratio: 1.6 (ref 1.2–2.2)
BILIRUBIN TOTAL: 0.4 mg/dL (ref 0.0–1.2)
BUN / CREAT RATIO: 15 (ref 12–28)
BUN: 13 mg/dL (ref 8–27)
CHLORIDE: 97 mmol/L (ref 96–106)
CO2: 26 mmol/L (ref 20–29)
Calcium: 9.8 mg/dL (ref 8.7–10.3)
Creatinine, Ser: 0.88 mg/dL (ref 0.57–1.00)
GFR calc Af Amer: 77 mL/min/{1.73_m2} (ref >59)
GFR calc non Af Amer: 67 mL/min/{1.73_m2} (ref >59)
GLUCOSE: 93 mg/dL (ref 65–99)
Globulin, Total: 2.8 g/dL (ref 1.5–4.5)
Potassium: 4.8 mmol/L (ref 3.5–5.2)
Sodium: 138 mmol/L (ref 134–144)
Total Protein: 7.3 g/dL (ref 6.0–8.5)

## 2017-02-05 LAB — HCV INTERPRETATION

## 2017-02-05 LAB — HCV AB W/RFLX TO VERIFICATION: HCV Ab: <0.1 s/co ratio (ref 0.0–0.9)

## 2017-03-27 ENCOUNTER — Ambulatory Visit (INDEPENDENT_AMBULATORY_CARE_PROVIDER_SITE_OTHER): Payer: Medicare HMO | Admitting: Family Medicine

## 2017-03-27 ENCOUNTER — Encounter: Payer: Self-pay | Admitting: Family Medicine

## 2017-03-27 ENCOUNTER — Ambulatory Visit (HOSPITAL_COMMUNITY)
Admission: RE | Admit: 2017-03-27 | Discharge: 2017-03-27 | Disposition: A | Discharge status: Home / Self Care | Payer: Medicare HMO | Source: Ambulatory Visit | Attending: Family Medicine | Admitting: Family Medicine

## 2017-03-27 VITALS — BP 122/80 | HR 84 | Resp 18 | Ht 63.0 in | Wt 192.6 lb

## 2017-03-27 DIAGNOSIS — R69 Illness, unspecified: Secondary | ICD-10-CM | POA: Diagnosis not present

## 2017-03-27 DIAGNOSIS — F17200 Nicotine dependence, unspecified, uncomplicated: Secondary | ICD-10-CM

## 2017-03-27 DIAGNOSIS — I951 Orthostatic hypotension: Secondary | ICD-10-CM

## 2017-03-27 DIAGNOSIS — I6782 Cerebral ischemia: Secondary | ICD-10-CM | POA: Insufficient documentation

## 2017-03-27 DIAGNOSIS — G4734 Idiopathic sleep related nonobstructive alveolar hypoventilation: Secondary | ICD-10-CM

## 2017-03-27 DIAGNOSIS — R269 Unspecified abnormalities of gait and mobility: Secondary | ICD-10-CM | POA: Diagnosis not present

## 2017-03-27 DIAGNOSIS — R0902 Hypoxemia: Secondary | ICD-10-CM

## 2017-03-27 DIAGNOSIS — R51 Headache: Secondary | ICD-10-CM | POA: Diagnosis not present

## 2017-03-27 DIAGNOSIS — R42 Dizziness and giddiness: Secondary | ICD-10-CM | POA: Diagnosis not present

## 2017-03-27 DIAGNOSIS — G44209 Tension-type headache, unspecified, not intractable: Secondary | ICD-10-CM | POA: Insufficient documentation

## 2017-03-27 LAB — CREATININE, SERUM
CREATININE: 0.91 mg/dL (ref 0.44–1.00)
GFR calc Af Amer: >60 mL/min (ref >60)

## 2017-03-27 MED ORDER — GADOBENATE DIMEGLUMINE 529 MG/ML IV SOLN
20.0000 mL | Freq: Once | INTRAVENOUS | Status: AC | PRN
  Administered 2017-03-27: 18 mL via INTRAVENOUS

## 2017-03-27 NOTE — Progress Notes (Signed)
Chief Complaint  Patient presents with  . Dizziness    for the past 2 week, usually when first standing up or walking, a few episodes of nausea, no vomiting    HPI New Problem: dizziness Pt reports that she has been having dizziness for the past two weeks  She has to hand on to things to move around She states that right now she does not feel as affected When she moves her head she feels like her eyes are lagging behind She states that it happened out of the blue and did not feel like she was having any URI She was in [place] when it first started.  She states that her onset was 1st of August with each day about the same. When she is sitting or in bed she does not feel like the room is spinning She has not falling She has not vomited but felt nauseous   Tobacco use She is a smoker She has a 40 pack year smoking history She also vapes    Lab Results  Component Value Date   CREATININE 0.91 03/27/2017    Hypertension: Patient here for follow-up of elevated blood pressure. She is complaint with her bp meds and denies history of hypotension.  She has normal pulse.  No chest pains, palpitations or shortness of breath. BP Readings from Last 3 Encounters:  03/27/17 122/80  02/04/17 139/90  01/06/17 121/75    Past Medical History:  Diagnosis Date  . Allergy     Current Outpatient Prescriptions  Medication Sig Dispense Refill  . aspirin EC 81 MG tablet Take 81 mg by mouth daily.    . fluticasone (FLONASE) 50 MCG/ACT nasal spray Place 2 sprays into both nostrils daily. 16 g 6  . HYDROcodone-acetaminophen (NORCO/VICODIN) 5-325 MG tablet Take 1 tablet by mouth every 6 (six) hours as needed for moderate pain.    [facility] levothyroxine (SYNTHROID, LEVOTHROID) 75 MCG tablet Take 1 tablet (75 mcg total) by mouth daily before breakfast. With no other meds 90 tablet 0  . lisinopril-hydrochlorothiazide (PRINZIDE,ZESTORETIC) 10-12.5 MG tablet TAKE 2 TABLETS BY MOUTH DAILY 180 tablet 0  . Misc.  Devices (PULSE OXIMETER) MISC 1 Device by Does not apply route Nightly. Apply nightly as needed 1 each 0  . omeprazole (PRILOSEC) 40 MG capsule TAKE 1 CAPSULE(40 MG) BY MOUTH DAILY 90 capsule 0  . traMADol (ULTRAM) 50 MG tablet Take 1 tablet (50 mg total) by mouth every 8 (eight) hours as needed. 30 tablet 0  . venlafaxine XR (EFFEXOR-XR) 150 MG 24 hr capsule Take 1 capsule (150 mg total) by mouth 2 (two) times daily with a meal. 60 capsule 6  . amitriptyline (ELAVIL) 50 MG tablet Take 50 mg by mouth at bedtime.    . pregabalin (LYRICA) 150 MG capsule Take 1 capsule (150 mg total) by mouth 2 (two) times daily. 180 capsule 1  . zolpidem (AMBIEN) 10 MG tablet Take 1 tablet (10 mg total) by mouth at bedtime as needed for sleep. 30 tablet 0   No current facility-administered medications for this visit.     Allergies:  Allergies  Allergen Reactions  . Sulfa Antibiotics     No past surgical history on file.  Social History   Social History  . Marital status: Divorced    Spouse name: N/A  . Number of children: N/A  . Years of education: N/A   Social History Main Topics  . Smoking status: Current Some Day Smoker    Packs/day: 0.75  Years: 37.00  . Smokeless tobacco: Current User  . Alcohol use No  . Drug use: No  . Sexual activity: Not Asked   Other Topics Concern  . None   Social History Narrative  . None    ROS See hpi Objective: Vitals:   03/27/17 1042  BP: 122/80  Pulse: 84  Resp: 18  SpO2: 94%  Weight: 192 lb 9.6 oz (87.4 kg)  Height: 5\' 3"  (1.6 m)    Physical Exam General: alert, oriented, in NAD Head: normocephalic, atraumatic, no sinus tenderness Eyes: EOM intact, no scleral icterus or conjunctival injection Ears: TM clear bilaterally Nose: mucosa nonerythematous, nonedematous Throat: no pharyngeal exudate or erythema Lymph: no posterior auricular, submental or cervical lymph adenopathy Heart: normal rate, normal sinus rhythm, no murmurs Lungs: clear  to auscultation bilaterally, no wheezing  CRANIAL NERVES: CN II: Visual fields are full to confrontation. Fundoscopic exam limited due to pupil constriction. CN III, IV, VI: extraocular movement are normal. No ptosis. CN V: Facial sensation is intact to pinprick in all 3 divisions bilaterally. Corneal responses are intact.  CN VII: Face is symmetric with normal eye closure and smile. CN VIII: Hearing is normal to rubbing fingers CN IX, X: Palate elevates symmetrically. Phonation is normal. CN XI: Head turning and shoulder shrug are intact CN XII: Tongue is midline with normal movements and no atrophy.  Neuro: gait with a left shift Patellar reflex 2+ No fasciculations noted Neg babinski  MRI brain-  No acute process, chronic ischemic changes  Assessment and Plan Bonnie Lawson was seen today for dizziness.  Diagnoses and all orders for this visit:  Smoker- smoking cessation advised  Nocturnal hypoxemia -     Ambulatory referral to Pulmonology -     Ambulatory referral to Neurology  Hypoxia- multiple attempts to get pt nocturnal oxygen  -     Ambulatory referral to Pulmonology  Acute non intractable tension-type headache-  Discussed that her untreated hypoxia is likely contributing -     MR Brain W Wo Contrast; Future -     Cancel: Basic metabolic panel; Future -     Cancel: Basic metabolic panel -     Ambulatory referral to Neurology  Dizziness and giddiness- discussed referral to Neurology -     CBC -     Cancel: Basic metabolic panel -     Cancel: POCT urinalysis dipstick -     Cancel: Basic metabolic panel; Future -     Cancel: Basic metabolic panel -     Basic Metabolic Panel; Future -     Ambulatory referral to Neurology -     Ambulatory referral to Physical Therapy  Orthostasis - discussed hydration, she has mild orthostatic hypotension  Gait abnormality- would refer to Neurology -     Cancel: Basic metabolic panel; Future -     Cancel: Basic metabolic panel -      Ambulatory referral to Neurology   A total of 40 minutes were spent face-to-face with the patient during this encounter and over half of that time was spent on counseling and coordination of care.   [place]

## 2017-03-27 NOTE — Patient Instructions (Addendum)
Go to [facility] at 4pm. Make sure to arrive at 3:30pm to have your blood drawn.     IF you received an x-ray today, you will receive an invoice from [facility]. Please contact [facility]iology at [phone] with questions or concerns regarding your invoice.   IF you received labwork today, you will receive an invoice from [facility]. Please contact [facility] at [phone] with questions or concerns regarding your invoice.   Our billing staff will not be able to assist you with questions regarding bills from these companies.  You will be contacted with the lab results as soon as they are available. The fastest way to get your results is to activate your My Chart account. Instructions are located on the last page of this paperwork. If you have not heard from [place] regarding the results in 2 weeks, please contact this office.

## 2017-03-28 DIAGNOSIS — R69 Illness, unspecified: Secondary | ICD-10-CM | POA: Diagnosis not present

## 2017-03-28 LAB — CBC
Hematocrit: 40.1 % (ref 34.0–46.6)
Hemoglobin: 13 g/dL (ref 11.1–15.9)
MCH: 28.6 pg (ref 26.6–33.0)
MCHC: 32.4 g/dL (ref 31.5–35.7)
MCV: 88 fL (ref 79–97)
PLATELETS: 286 10*3/uL (ref 150–379)
RBC: 4.54 x10E6/uL (ref 3.77–5.28)
RDW: 15.3 % (ref 12.3–15.4)
WBC: 8.4 10*3/uL (ref 3.4–10.8)

## 2017-04-02 ENCOUNTER — Other Ambulatory Visit: Payer: Self-pay | Admitting: Family Medicine

## 2017-04-02 ENCOUNTER — Telehealth: Payer: Self-pay | Admitting: Family Medicine

## 2017-04-02 DIAGNOSIS — E039 Hypothyroidism, unspecified: Secondary | ICD-10-CM

## 2017-04-02 NOTE — Telephone Encounter (Signed)
Spoke with patient advised Dr. Nolon Rod is out of the office and will not be back until Monday 04/06/17.  Advised will send request to her and will contact her back with decision on refills for lyrica and norco/vicodin. dg

## 2017-04-02 NOTE — Telephone Encounter (Signed)
Pt is requesting a med refill on pregabalin (LYRICA) 150 MG and HYDROcodone-acetaminophen (NORCO/VICODIN) (dr Nolon Rod is not the ordering phy on this medication, but pt states per her last visit stallings stated that she will give her a refill).  Please adv,  Contact number [phone]

## 2017-04-03 MED ORDER — PREGABALIN 150 MG PO CAPS: 150.0000 mg | ORAL_CAPSULE | Freq: Two times a day (BID) | ORAL | 0 refills | Status: DC

## 2017-04-03 NOTE — Telephone Encounter (Signed)
Please let the patient know that I refilled her thyroid medication.  It has not been checked in a while so in a week she should come in to check her thyroid levels in early September.  This is a lab only visit to check her thyroid function. I already ordered the lab. Her last levels were check September 2017 and were normal.   Lab Results  Component Value Date   TSH 2.39 04/26/2016

## 2017-04-03 NOTE — Telephone Encounter (Signed)
I can refill the lyrica but will not refill the norco.  My reason is that lyrica, ambien and norco together will cause dizziness and increases risk for falls.  Let her know guidelines recommend against these meds together especially in patients over 65.  The last Norco fill was 11/2015 in the [place] drug database so there is no risk of withdrawal.  Let her know to follow up with the neurologist and physical therapy for her dizziness.  If she needs to discuss this further she needs an office visit.   Please phone in the [place].  Let her know to use caution since Lyrica causes dizziness.

## 2017-04-07 NOTE — Telephone Encounter (Signed)
Left message to return call 

## 2017-04-11 ENCOUNTER — Encounter: Payer: Self-pay | Admitting: Family Medicine

## 2017-04-11 ENCOUNTER — Ambulatory Visit (INDEPENDENT_AMBULATORY_CARE_PROVIDER_SITE_OTHER): Payer: Medicare HMO | Admitting: Family Medicine

## 2017-04-11 VITALS — BP 119/73 | HR 71 | Temp 98.0°F | Resp 16 | Ht 63.0 in | Wt 192.6 lb

## 2017-04-11 DIAGNOSIS — I10 Essential (primary) hypertension: Secondary | ICD-10-CM

## 2017-04-11 DIAGNOSIS — G894 Chronic pain syndrome: Secondary | ICD-10-CM | POA: Diagnosis not present

## 2017-04-11 DIAGNOSIS — R42 Dizziness and giddiness: Secondary | ICD-10-CM | POA: Diagnosis not present

## 2017-04-11 DIAGNOSIS — R0981 Nasal congestion: Secondary | ICD-10-CM

## 2017-04-11 DIAGNOSIS — Z1211 Encounter for screening for malignant neoplasm of colon: Secondary | ICD-10-CM | POA: Diagnosis not present

## 2017-04-11 DIAGNOSIS — G4734 Idiopathic sleep related nonobstructive alveolar hypoventilation: Secondary | ICD-10-CM | POA: Diagnosis not present

## 2017-04-11 DIAGNOSIS — F17200 Nicotine dependence, unspecified, uncomplicated: Secondary | ICD-10-CM

## 2017-04-11 DIAGNOSIS — R0902 Hypoxemia: Secondary | ICD-10-CM

## 2017-04-11 DIAGNOSIS — Z23 Encounter for immunization: Secondary | ICD-10-CM

## 2017-04-11 DIAGNOSIS — R69 Illness, unspecified: Secondary | ICD-10-CM | POA: Diagnosis not present

## 2017-04-11 MED ORDER — HYDROCODONE-ACETAMINOPHEN 5-325 MG PO TABS: 1.0000 | ORAL_TABLET | Freq: Four times a day (QID) | ORAL | 0 refills | Status: DC | PRN

## 2017-04-11 NOTE — Progress Notes (Signed)
Chief Complaint  Patient presents with  . med consult    med refill on hydrocodone  . Leg Pain    left leg/ankle is getting worse, wants to talk with MD about it    HPI Controlled Substance Refill Pt reports that she has not taken Hydrocodone regularly She reports that she has moderate pain all the time but gets severe pain from her back and ankle every once in a while. When she cooks or stands for long periods she can aggravate the pain. Her last refill in the [facility] last refill was 11/2015. She states that she is not longer taking ambien or Amitriptylline She has not taken any tramadol She gets constipation with Norco so she uses it sparingly  Chronic Back Pain and Chronic Ankle Pain She reports that she takes it at times when she feels severe back pain and still has 2 tablets of Norco from 11/28/2015. She reports that her back pain is all the time. She takes Lyrica for her leg pain which is from a previous injury. She takes two tablets of lyrica at 5:30pm She reports that now she is getting burning pain from the top of her foot up her ankle She reports that back in 2001 she broke her ankle  Nasal congestion She reports that when she goes from heat into cold air conditioning she gets nasal congestion She uses flonase, antihistamine and is still congested She reports that her sinus congestion does not make her dizzy.    Depression screen [facility] 2/9 04/11/2017 03/27/2017 02/04/2017 02/04/2017 01/06/2017  Decreased Interest 0 1 1 0 0  Down, Depressed, Hopeless 0 1 1 0 0  PHQ - 2 Score 0 2 2 0 0  Altered sleeping - 0 0 - -  Tired, decreased energy - 3 3 - -  Change in appetite - 0 0 - -  Feeling bad or failure about yourself  - 0 1 - -  Trouble concentrating - 3 2 - -  Moving slowly or fidgety/restless - 0 1 - -  Suicidal thoughts - 0 0 - -  PHQ-9 Score - 8 9 - -  Difficult doing work/chores - - Somewhat difficult - -    Past Medical History:  Diagnosis Date  . Allergy      Current Outpatient Prescriptions  Medication Sig Dispense Refill  . aspirin EC 81 MG tablet Take 81 mg by mouth daily.    . fluticasone (FLONASE) 50 MCG/ACT nasal spray Place 2 sprays into both nostrils daily. 16 g 6  . HYDROcodone-acetaminophen (NORCO/VICODIN) 5-325 MG tablet Take 1 tablet by mouth every 6 (six) hours as needed for moderate pain. 30 tablet 0  . levothyroxine (SYNTHROID, LEVOTHROID) 75 MCG tablet TAKE 1 TABLET BY MOUTH DAILY BEFORE BREAKFAST WITH NO OTHER MEDS 90 tablet 0  . lisinopril-hydrochlorothiazide (PRINZIDE,ZESTORETIC) 10-12.5 MG tablet TAKE 2 TABLETS BY MOUTH DAILY 180 tablet 0  . Misc. Devices (PULSE OXIMETER) MISC 1 Device by Does not apply route Nightly. Apply nightly as needed 1 each 0  . omeprazole (PRILOSEC) 40 MG capsule TAKE 1 CAPSULE(40 MG) BY MOUTH DAILY 90 capsule 0  . pregabalin (LYRICA) 150 MG capsule Take 1 capsule (150 mg total) by mouth 2 (two) times daily. 180 capsule 0  . venlafaxine XR (EFFEXOR-XR) 150 MG 24 hr capsule Take 1 capsule (150 mg total) by mouth 2 (two) times daily with a meal. (Patient not taking: Reported on 04/11/2017) 60 capsule 6   No current facility-administered medications for  this visit.     Allergies:  Allergies  Allergen Reactions  . Sulfa Antibiotics     History reviewed. No pertinent surgical history.  Social History   Social History  . Marital status: Divorced    Spouse name: N/A  . Number of children: N/A  . Years of education: N/A   Social History Main Topics  . Smoking status: Current Some Day Smoker    Packs/day: 0.75    Years: 37.00  . Smokeless tobacco: Current User  . Alcohol use No  . Drug use: No  . Sexual activity: Not Asked   Other Topics Concern  . None   Social History Narrative  . None    Review of Systems  Constitutional: Negative for chills and fever.  Eyes: Negative for blurred vision and double vision.  Respiratory: Negative for cough and shortness of breath.    Cardiovascular: Negative for chest pain, palpitations and leg swelling.  Gastrointestinal: Negative for abdominal pain, nausea and vomiting.  Genitourinary: Negative for dysuria, frequency and urgency.  Skin: Negative for itching and rash.  Neurological: Positive for dizziness. Negative for tingling, tremors and headaches.  Psychiatric/Behavioral: Negative for depression and hallucinations. The patient has insomnia.     Objective: Vitals:   04/11/17 1124  BP: 119/73  Pulse: 71  Resp: 16  Temp: 98 F (36.7 C)  TempSrc: Oral  SpO2: 100%  Weight: 192 lb 9.6 oz (87.4 kg)  Height: 5\' 3"  (1.6 m)    Physical Exam  Constitutional: She is oriented to person, place, and time. She appears well-developed and well-nourished.  HENT:  Head: Normocephalic and atraumatic.  Eyes: Conjunctivae and EOM are normal.  Cardiovascular: Normal rate, regular rhythm and normal heart sounds.   Pulmonary/Chest: Effort normal and breath sounds normal. No respiratory distress. She has no wheezes.  Neurological: She is alert and oriented to person, place, and time.  Psychiatric: Her behavior is normal. Judgment and thought content normal.    Assessment and Plan Bonnie Lawson was seen today for med consult and leg pain.  Diagnoses and all orders for this visit:  Smoker- discussed cessation, pt still doing e-cigs Nasal congestion-Likely congestion also due to the smoking continue antihistamine  Nocturnal hypoxemia Hypoxia -  Pt has appt for pulmonology consultation Concerning that her hypoxemia and continued smoking are affecting her dizziness and worsening her resp function   Essential hypertension- bp in good range today, cpm  Dizziness and giddiness- unchanged  Chronic pain syndrome- discontinued the tramadol, pt already off the amitriptyline and ambien  -     HYDROcodone-acetaminophen (NORCO/VICODIN) 5-325 MG tablet; Take 1 tablet by mouth every 6 (six) hours as needed for moderate pain. -     Drug  Screen, Urine  Screening for colon cancer -     Ambulatory referral to Gastroenterology  Need for Tdap vaccination -     Tdap vaccine greater than or equal to 7yo IM  Need for prophylactic vaccination and inoculation against influenza -     Flu Vaccine QUAD 36+ mos IM  Need for prophylactic vaccination against Streptococcus pneumoniae (pneumococcus) -     Pneumococcal polysaccharide vaccine 23-valent greater than or equal to 2yo subcutaneous/IM   A total of 43 minutes were spent face-to-face with the patient during this encounter and over half of that time was spent on counseling and coordination of care.   [place]

## 2017-04-11 NOTE — Patient Instructions (Addendum)
IF you received an x-ray today, you will receive an invoice from [facility]. Please contact [facility] at [phone] with questions or concerns regarding your invoice.   IF you received labwork today, you will receive an invoice from [facility] [place] [facility]. Please contact [facility] at [phone] with questions or concerns regarding your invoice.   Our billing staff will not be able to assist you with questions regarding bills from these companies.  You will be contacted with the lab results as soon as they are available. The fastest way to get your results is to activate your My Chart account. Instructions are located on the last page of this paperwork. If you have not heard from [place] regarding the results in 2 weeks, please contact this office.     Dizziness Dizziness is a common problem. It is a feeling of unsteadiness or light-headedness. You may feel like you are about to faint. Dizziness can lead to injury if you stumble or fall. Anyone can become dizzy, but dizziness is more common in older adults. This condition can be caused by a number of things, including medicines, dehydration, or illness. Follow these instructions at home: Taking these steps may help with your condition: Eating and drinking  Drink enough fluid to keep your urine clear or pale yellow. This helps to keep you from becoming dehydrated. Try to drink more clear fluids, such as water.  Do not drink alcohol.  Limit your caffeine intake if directed by your health care provider.  Limit your salt intake if directed by your health care provider. Activity  Avoid making quick movements. ? Rise slowly from chairs and steady yourself until you feel okay. ? In the morning, first sit up on the side of the bed. When you feel okay, stand slowly while you hold onto something until you know that your balance is fine.  Move your legs often if you need to stand in one place for a long time. Tighten and relax your  muscles in your legs while you are standing.  Do not drive or operate heavy machinery if you feel dizzy.  Avoid bending down if you feel dizzy. Place items in your home so that they are easy for you to reach without leaning over. Lifestyle  Do not use any tobacco products, including cigarettes, chewing tobacco, or electronic cigarettes. If you need help quitting, ask your health care provider.  Try to reduce your stress level, such as with yoga or meditation. Talk with your health care provider if you need help. General instructions  Watch your dizziness for any changes.  Take medicines only as directed by your health care provider. Talk with your health care provider if you think that your dizziness is caused by a medicine that you are taking.  Tell a friend or a family member that you are feeling dizzy. If he or she notices any changes in your behavior, have this person call your health care provider.  Keep all follow-up visits as directed by your health care provider. This is important. Contact a health care provider if:  Your dizziness does not go away.  Your dizziness or light-headedness gets worse.  You feel nauseous.  You have reduced hearing.  You have new symptoms.  You are unsteady on your feet or you feel like the room is spinning. Get help right away if:  You vomit or have diarrhea and are unable to eat or drink anything.  You have problems talking, walking, swallowing, or using your arms,  hands, or legs.  You feel generally weak.  You are not thinking clearly or you have trouble forming sentences. It may take a friend or family member to notice this.  You have chest pain, abdominal pain, shortness of breath, or sweating.  Your vision changes.  You notice any bleeding.  You have a headache.  You have neck pain or a stiff neck.  You have a fever. This information is not intended to replace advice given to you by your health care provider. Make sure you  discuss any questions you have with your health care provider. Document Released: 01/28/2001 Document Revised: 01/10/2016 Document Reviewed: 07/31/2014 Elsevier Interactive Patient Education  2017 [employer].

## 2017-04-14 ENCOUNTER — Telehealth: Payer: Self-pay | Admitting: Family Medicine

## 2017-04-14 DIAGNOSIS — M5136 Other intervertebral disc degeneration, lumbar region: Secondary | ICD-10-CM

## 2017-04-14 LAB — DRUG SCREEN, URINE
Amphetamines, Urine: NEGATIVE ng/mL
Barbiturate screen, urine: NEGATIVE ng/mL
Benzodiazepine Quant, Ur: NEGATIVE ng/mL
CANNABINOID QUANT UR: NEGATIVE ng/mL
COCAINE (METAB.): NEGATIVE ng/mL
Opiate Quant, Ur: NEGATIVE ng/mL
PCP QUANT UR: NEGATIVE ng/mL

## 2017-04-14 MED ORDER — PREGABALIN 200 MG PO CAPS: 400.0000 mg | ORAL_CAPSULE | Freq: Every day | ORAL | 0 refills | Status: DC

## 2017-04-14 NOTE — Telephone Encounter (Signed)
PATIENT STATES SHE WAS IN TO SEE DR. STALLINGS ON SAT. (04/11/17) AND THEY TALKED ABOUT HER PREGABALIN (LYRICA). SHE SAID SHE TOLD DR. STALLINGS THAT IT IS STARTING TO BOTHER HER DURING THE DAY WITH THE BURNING SENSATION, BUT SHE NEVER TOLD HER WHAT SHE NEEDS TO DO? BEST PHONE [phone] (CELL) PHARMACY CHOICE IS [place]. [place]

## 2017-04-14 NOTE — Telephone Encounter (Signed)
Pt called wanting to know what to do about the continued burning sensation at the top of her foot.  Since she is taking lyrica 150mg  x2 every evening  Increased her dose to 200mg  x2 every evening for 400mg  Medication called in

## 2017-04-14 NOTE — Telephone Encounter (Signed)
Pt  Called about Lyrica.  She was taking 150mg  - 3tabs a day for a total of 450mg  without any improvement in her burning pain.  will referr for MRI lumbar spine Instructed patient to take lyrica 200mg  twice a day for pain Follow up with MRI spine and referral for Orthopedics  DG Lumbar Spine 1 View 04/22/2005 I Study Result   Clinical Data: L3-4 disk herniation.  PORTABLE LUMBAR SPINE - 2 VIEW:  Findings: Intraoperative cross-table lateral radiograph #1 obtained at 0958 hours, shows needles posteriorly overlying the interspinous spaces at the levels of L1-2, L2-3, and L3-4.  Intraoperative cross-table lateral radiograph #2 obtained at 1023 hours, shows instruments overlying the spinous processes of L3 and L4. Severe degenerative disk disease is noted at L4-5 and to a lesser degree L3-4.  IMPRESSION:   Intraoperative localization of L3-4 interspinous space.

## 2017-04-14 NOTE — Addendum Note (Signed)
Addended by: Delia Chimes A on: 04/14/2017 03:09 PM   Modules accepted: Orders

## 2017-04-16 ENCOUNTER — Telehealth: Payer: Self-pay | Admitting: *Deleted

## 2017-04-16 NOTE — Telephone Encounter (Signed)
LMOM to call the ofc. 

## 2017-05-02 ENCOUNTER — Other Ambulatory Visit: Payer: Self-pay | Admitting: Family Medicine

## 2017-05-02 DIAGNOSIS — F321 Major depressive disorder, single episode, moderate: Secondary | ICD-10-CM

## 2017-05-02 DIAGNOSIS — F32A Depression, unspecified: Secondary | ICD-10-CM

## 2017-05-06 ENCOUNTER — Telehealth: Payer: Self-pay | Admitting: Family Medicine

## 2017-05-06 ENCOUNTER — Ambulatory Visit (HOSPITAL_COMMUNITY)
Admission: RE | Admit: 2017-05-06 | Discharge: 2017-05-06 | Disposition: A | Discharge status: Home / Self Care | Payer: Medicare HMO | Source: Ambulatory Visit | Attending: Family Medicine | Admitting: Family Medicine

## 2017-05-06 DIAGNOSIS — M5136 Other intervertebral disc degeneration, lumbar region: Secondary | ICD-10-CM | POA: Diagnosis not present

## 2017-05-06 DIAGNOSIS — F32A Depression, unspecified: Secondary | ICD-10-CM

## 2017-05-06 DIAGNOSIS — F321 Major depressive disorder, single episode, moderate: Secondary | ICD-10-CM

## 2017-05-06 DIAGNOSIS — M5126 Other intervertebral disc displacement, lumbar region: Secondary | ICD-10-CM | POA: Insufficient documentation

## 2017-05-06 NOTE — Telephone Encounter (Signed)
Pt has changed pharmacies and is needing a written rx on each of these to be able to get refilled   Best number  To pharmacy [phone]  Pt needs flonase, synthroid, lisinopril-hydrochlorothiiazide, prilosec,effexor

## 2017-05-07 NOTE — Telephone Encounter (Signed)
Please advise 

## 2017-05-11 ENCOUNTER — Telehealth: Payer: Self-pay

## 2017-05-11 MED ORDER — LISINOPRIL-HYDROCHLOROTHIAZIDE 10-12.5 MG PO TABS: 2.0000 | ORAL_TABLET | Freq: Every day | ORAL | 0 refills | Status: DC

## 2017-05-11 MED ORDER — LEVOTHYROXINE SODIUM 75 MCG PO TABS: ORAL_TABLET | ORAL | 0 refills | Status: DC

## 2017-05-11 MED ORDER — VENLAFAXINE HCL ER 150 MG PO CP24
ORAL_CAPSULE | ORAL | 3 refills | Status: DC
Start: 2017-05-11 — End: 2018-08-02

## 2017-05-11 MED ORDER — FLUTICASONE PROPIONATE 50 MCG/ACT NA SUSP: 2.0000 | Freq: Every day | NASAL | 6 refills | Status: DC

## 2017-05-11 MED ORDER — OMEPRAZOLE 40 MG PO CPDR
DELAYED_RELEASE_CAPSULE | ORAL | 3 refills | Status: DC
Start: 2017-05-11 — End: 2017-05-13

## 2017-05-11 MED ORDER — OMEPRAZOLE 40 MG PO CPDR: DELAYED_RELEASE_CAPSULE | ORAL | 3 refills | Status: DC

## 2017-05-11 MED ORDER — VENLAFAXINE HCL ER 150 MG PO CP24: ORAL_CAPSULE | ORAL | 3 refills | Status: DC

## 2017-05-11 NOTE — Telephone Encounter (Signed)
Spoke with pt advised prescriptions for effexor xr, flonase, levothyroxine, lisinopril-hydrochlorothiazide and omeprazole ready for p/u at her convenience- pt agreeable.  Advised scripts will be left at the [facility] [address]. [place], CMA

## 2017-05-11 NOTE — Telephone Encounter (Signed)
Printed. Not sure which pharmacy she is using

## 2017-05-13 ENCOUNTER — Ambulatory Visit (INDEPENDENT_AMBULATORY_CARE_PROVIDER_SITE_OTHER): Payer: Medicare HMO | Admitting: Neurology

## 2017-05-13 ENCOUNTER — Encounter: Payer: Self-pay | Admitting: Neurology

## 2017-05-13 VITALS — BP 122/77 | HR 83 | Ht 63.0 in | Wt 187.0 lb

## 2017-05-13 DIAGNOSIS — R42 Dizziness and giddiness: Secondary | ICD-10-CM | POA: Diagnosis not present

## 2017-05-13 NOTE — Telephone Encounter (Signed)
See 05/11/17 telephone note.  Pt scripts sent via fax to pharmacy and pt notified.

## 2017-05-13 NOTE — Progress Notes (Signed)
[facility]    Provider:  Dr Jaynee Eagles Referring Provider: Forrest Moron, MD Primary Care Physician:  Forrest Moron, MD  CC: Dizziness  HPI:  Bonnie Lawson is a 71 y.o. female here as a referral from Dr. Nolon Rod for stalling. She had a sleep evaluation and was diagnosed with nocturnal hypoxemia, has not heard about implementing the O2. She had dizziness back in July, resolved as of this month hasn't had it in weeks. The dizziness was like she "left her eyes behind me" when turned head. Happened with movement of the head to the right or left. The room didn't spin, symptoms would last all day and anytime she would walk or move. If sitting still or laying down didn't bother her. Completely resolved. No nausea. She felt dizzy and lightheaded. No inciting events. Denies headaches. No pain. Standing still would resolve symptoms, moving or walking would make it worse. No chest pain. No falls. Would happen any time of day. No ear pain. No other focal neurologic deficits, associated symptoms, inciting events or modifiable factors.  Reviewed notes, labs and imaging from outside physicians, which showed:  MRI brain 03/2017: 1. No acute intracranial process. 2. Mild for age chronic microvascular ischemic disease.  BMP, CBC normal  Review of Systems: Patient complains of symptoms per HPI as well as the following symptoms: no CP, no SOB. Pertinent negatives and positives per HPI. All others negative.   Social History   Social History  . Marital status: Divorced    Spouse name: N/A  . Number of children: N/A  . Years of education: N/A   Occupational History  . Not on file.   Social History Main Topics  . Smoking status: Current Some Day Smoker    Packs/day: 0.75    Years: 37.00  . Smokeless tobacco: Current User  . Alcohol use No  . Drug use: No  . Sexual activity: Not on file   Other Topics Concern  . Not on file   Social History Narrative  . No narrative on  file    No family history on file.  Past Medical History:  Diagnosis Date  . Allergy     Past Surgical History:  Procedure Laterality Date  . NO PAST SURGERIES      Current Outpatient Prescriptions  Medication Sig Dispense Refill  . aspirin EC 81 MG tablet Take 81 mg by mouth daily.    . celecoxib (CELEBREX) 200 MG capsule Take 200 mg by mouth 2 (two) times daily.    . cyclobenzaprine (FLEXERIL) 10 MG tablet Take 10 mg by mouth 3 (three) times daily as needed for muscle spasms.    . fluticasone (FLONASE) 50 MCG/ACT nasal spray Place 2 sprays into both nostrils daily. 16 g 6  . HYDROcodone-acetaminophen (NORCO/VICODIN) 5-325 MG tablet Take 1 tablet by mouth every 6 (six) hours as needed for moderate pain. 30 tablet 0  . levothyroxine (SYNTHROID, LEVOTHROID) 75 MCG tablet TAKE 1 TABLET BY MOUTH DAILY BEFORE BREAKFAST WITH NO OTHER MEDS 90 tablet 0  . lisinopril-hydrochlorothiazide (PRINZIDE,ZESTORETIC) 20-25 MG tablet Take 1 tablet by mouth daily.    Marland Kitchen omeprazole (PRILOSEC) 20 MG capsule Take 20 mg by mouth daily.    . pregabalin (LYRICA) 150 MG capsule Take 150 mg by mouth 2 (two) times daily.    Marland Kitchen venlafaxine XR (EFFEXOR-XR) 150 MG 24 hr capsule TAKE 1 CAPSULE(150 MG) BY MOUTH TWICE DAILY WITH A MEAL 180 capsule 3   No current facility-administered medications for  this visit.     Allergies as of 05/13/2017 - Review Complete 05/13/2017  Allergen Reaction Noted  . Sulfa antibiotics  04/22/2016    Vitals: BP 122/77   Pulse 83   Ht 5\' 3"  (1.6 m)   Wt 187 lb (84.8 kg)   BMI 33.13 kg/m  Last Weight:  Wt Readings from Last 1 Encounters:  05/13/17 187 lb (84.8 kg)   Last Height:   Ht Readings from Last 1 Encounters:  05/13/17 5\' 3"  (1.6 m)    Physical exam: Exam: Gen: NAD, conversant, well nourised, obese, well groomed                     CV: RRR, no MRG. No Carotid Bruits. No peripheral edema, warm, nontender Eyes: Conjunctivae clear without exudates or  hemorrhage  Neuro: Detailed Neurologic Exam  Speech:    Speech is normal; fluent and spontaneous with normal comprehension.  Cognition:    The patient is oriented to person, place, and time;     recent and remote memory intact;     language fluent;     normal attention, concentration,     fund of knowledge Cranial Nerves:    The pupils are equal, round, and reactive to light. Attempted funduscopic exam could not visualize. Visual fields are full to finger confrontation. Extraocular movements are intact. Trigeminal sensation is intact and the muscles of mastication are normal. The face is symmetric. The palate elevates in the midline. Hearing intact. Voice is normal. Shoulder shrug is normal. The tongue has normal motion without fasciculations.   Coordination:    No dysmeyria  Gait:    Antalgic due to back pain  Motor Observation:    No asymmetry, no atrophy, and no involuntary movements noted. Tone:    Normal muscle tone.    Posture:    Posture is normal. normal erect    Strength: left mild hip flexion and leg flexion weakness otherwise strength is V/V in the upper and lower limbs.      Sensation: intact to LT     Reflex Exam:  DTR's:    Absent left patellar otherwise Deep tendon reflexes in the upper and lower extremities are brisk bilaterally.   Toes:    The toes are downgoing bilaterally.   Clonus:    Clonus is absent.      Assessment/Plan:  This is a very nice 71 year old female with vestibular symptoms that have resolved. Neurologic exam is largely unremarkable. May have been designed positional vertigo, labyrinthitis, MRI of the brain was normal and did not show any strokes. She denies any ongoing symptoms. No headaches. No vision changes. She was evaluated for nocturnal hypoxemia and she should return to physicians who diagnosed her with this for treatment, highly encouraged.  Nocturnal Hypoxemia: She has to return to Dr. Annamaria Boots for this as he did the initial  sleep evaluation and made these recommendations or Dr. Nolon Rod as she referred to sleep studies. Highly encourage her to follow up on this.  Also may consider cardiac evaluation given her extensive history of smoking. Smoking cessation was discussed.  Vestibular symptoms: They have resolved. MRI of the brain normal. Rrecommend if symptoms recur ahe can call us and that Dr. Nolon Rod consider vestibular therapy at physical therapy department.  She has new or worsening symptoms or any strokelike symptoms advised her to call [place], [facility] [address] [place], [place] [address]  Phone [phone] Fax [phone]

## 2017-05-13 NOTE — Patient Instructions (Signed)
Dizziness Dizziness is a common problem. It is a feeling of unsteadiness or light-headedness. You may feel like you are about to faint. Dizziness can lead to injury if you stumble or fall. Anyone can become dizzy, but dizziness is more common in older adults. This condition can be caused by a number of things, including medicines, dehydration, or illness. Follow these instructions at home: Taking these steps may help with your condition: Eating and drinking  Drink enough fluid to keep your urine clear or pale yellow. This helps to keep you from becoming dehydrated. Try to drink more clear fluids, such as water.  Do not drink alcohol.  Limit your caffeine intake if directed by your health care provider.  Limit your salt intake if directed by your health care provider. Activity  Avoid making quick movements. ? Rise slowly from chairs and steady yourself until you feel okay. ? In the morning, first sit up on the side of the bed. When you feel okay, stand slowly while you hold onto something until you know that your balance is fine.  Move your legs often if you need to stand in one place for a long time. Tighten and relax your muscles in your legs while you are standing.  Do not drive or operate heavy machinery if you feel dizzy.  Avoid bending down if you feel dizzy. Place items in your home so that they are easy for you to reach without leaning over. Lifestyle  Do not use any tobacco products, including cigarettes, chewing tobacco, or electronic cigarettes. If you need help quitting, ask your health care provider.  Try to reduce your stress level, such as with yoga or meditation. Talk with your health care provider if you need help. General instructions  Watch your dizziness for any changes.  Take medicines only as directed by your health care provider. Talk with your health care provider if you think that your dizziness is caused by a medicine that you are taking.  Tell a friend or  a family member that you are feeling dizzy. If he or she notices any changes in your behavior, have this person call your health care provider.  Keep all follow-up visits as directed by your health care provider. This is important. Contact a health care provider if:  Your dizziness does not go away.  Your dizziness or light-headedness gets worse.  You feel nauseous.  You have reduced hearing.  You have new symptoms.  You are unsteady on your feet or you feel like the room is spinning. Get help right away if:  You vomit or have diarrhea and are unable to eat or drink anything.  You have problems talking, walking, swallowing, or using your arms, hands, or legs.  You feel generally weak.  You are not thinking clearly or you have trouble forming sentences. It may take a friend or family member to notice this.  You have chest pain, abdominal pain, shortness of breath, or sweating.  Your vision changes.  You notice any bleeding.  You have a headache.  You have neck pain or a stiff neck.  You have a fever. This information is not intended to replace advice given to you by your health care provider. Make sure you discuss any questions you have with your health care provider. Document Released: 01/28/2001 Document Revised: 01/10/2016 Document Reviewed: 07/31/2014 Elsevier Interactive Patient Education  2017 [employer]  Vertigo Vertigo is the feeling that you or your surroundings are moving when they are not. Vertigo  can be dangerous if it occurs while you are doing something that could endanger you or others, such as driving. What are the causes? This condition is caused by a disturbance in the signals that are sent by your body's sensory systems to your brain. Different causes of a disturbance can lead to vertigo, including:  Infections, especially in the inner ear.  A bad reaction to a drug, or misuse of alcohol and medicines.  Withdrawal from drugs or  alcohol.  Quickly changing positions, as when lying down or rolling over in bed.  Migraine headaches.  Decreased blood flow to the brain.  Decreased blood pressure.  Increased pressure in the brain from a head or neck injury, stroke, infection, tumor, or bleeding.  Central nervous system disorders.  What are the signs or symptoms? Symptoms of this condition usually occur when you move your head or your eyes in different directions. Symptoms may start suddenly, and they usually last for less than a minute. Symptoms may include:  Loss of balance and falling.  Feeling like you are spinning or moving.  Feeling like your surroundings are spinning or moving.  Nausea and vomiting.  Blurred vision or double vision.  Difficulty hearing.  Slurred speech.  Dizziness.  Involuntary eye movement (nystagmus).  Symptoms can be mild and cause only slight annoyance, or they can be severe and interfere with daily life. Episodes of vertigo may return (recur) over time, and they are often triggered by certain movements. Symptoms may improve over time. How is this diagnosed? This condition may be diagnosed based on medical history and the quality of your nystagmus. Your health care provider may test your eye movements by asking you to quickly change positions to trigger the nystagmus. This may be called the Dix-Hallpike test, head thrust test, or roll test. You may be referred to a health care provider who specializes in ear, nose, and throat (ENT) problems (otolaryngologist) or a provider who specializes in disorders of the central nervous system (neurologist). You may have additional testing, including:  A physical exam.  Blood tests.  MRI.  A CT scan.  An electrocardiogram (ECG). This records electrical activity in your heart.  An electroencephalogram (EEG). This records electrical activity in your brain.  Hearing tests.  How is this treated? Treatment for this condition depends  on the cause and the severity of the symptoms. Treatment options include:  Medicines to treat nausea or vertigo. These are usually used for severe cases. Some medicines that are used to treat other conditions may also reduce or eliminate vertigo symptoms. These include: ? Medicines that control allergies (antihistamines). ? Medicines that control seizures (anticonvulsants). ? Medicines that relieve depression (antidepressants). ? Medicines that relieve anxiety (sedatives).  Head movements to adjust your inner ear back to normal. If your vertigo is caused by an ear problem, your health care provider may recommend certain movements to correct the problem.  Surgery. This is rare.  Follow these instructions at home: Safety  Move slowly.Avoid sudden body or head movements.  Avoid driving.  Avoid operating heavy machinery.  Avoid doing any tasks that would cause danger to you or others if you would have a vertigo episode during the task.  If you have trouble walking or keeping your balance, try using a cane for stability. If you feel dizzy or unstable, sit down right away.  Return to your normal activities as told by your health care provider. Ask your health care provider what activities are safe for you. General instructions  Take over-the-counter and prescription medicines only as told by your health care provider.  Avoid certain positions or movements as told by your health care provider.  Drink enough fluid to keep your urine clear or pale yellow.  Keep all follow-up visits as told by your health care provider. This is important. Contact a health care provider if:  Your medicines do not relieve your vertigo or they make it worse.  You have a fever.  Your condition gets worse or you develop new symptoms.  Your family or friends notice any behavioral changes.  Your nausea or vomiting gets worse.  You have numbness or a "pins and needles" sensation in part of your  body. Get help right away if:  You have difficulty moving or speaking.  You are always dizzy.  You faint.  You develop severe headaches.  You have weakness in your hands, arms, or legs.  You have changes in your hearing or vision.  You develop a stiff neck.  You develop sensitivity to light. This information is not intended to replace advice given to you by your health care provider. Make sure you discuss any questions you have with your health care provider. Document Released: 05/14/2005 Document Revised: 01/16/2016 Document Reviewed: 11/27/2014 Elsevier Interactive Patient Education  2017 [employer].

## 2017-05-14 ENCOUNTER — Telehealth: Payer: Self-pay | Admitting: Family Medicine

## 2017-05-14 NOTE — Telephone Encounter (Addendum)
(  PLEASE SEND THIS MESSAGE TO DR. Nolon Rod. I THOUGHT THE PATIENT SAID DR. SHAW) PATIENT STATES SHE RECEIVED A CALL FROM  PULMONARY TO SCHEDULE AN APPOINTMENT TO SEE ONE OF THEIR SPECIALIST. SHE WOULD LIKE DR. STALLINGS TO TELL HER WHY SHE IS GOING THERE? HER APPOINTMENT IS Monday 05/18/17. BEST PHONE [phone] (CELL) [place]

## 2017-05-15 ENCOUNTER — Telehealth: Payer: Self-pay | Admitting: *Deleted

## 2017-05-15 ENCOUNTER — Encounter: Payer: Self-pay | Admitting: Family Medicine

## 2017-05-15 NOTE — Telephone Encounter (Signed)
Pt called wanting to know why she has an appt with [facility], pt advised per Dr. Kaleen Mask chart note.

## 2017-05-18 ENCOUNTER — Telehealth: Payer: Self-pay | Admitting: Family Medicine

## 2017-05-18 ENCOUNTER — Ambulatory Visit (INDEPENDENT_AMBULATORY_CARE_PROVIDER_SITE_OTHER): Payer: Medicare HMO | Admitting: Internal Medicine

## 2017-05-18 ENCOUNTER — Encounter: Payer: Self-pay | Admitting: Internal Medicine

## 2017-05-18 ENCOUNTER — Ambulatory Visit (INDEPENDENT_AMBULATORY_CARE_PROVIDER_SITE_OTHER)
Admission: RE | Admit: 2017-05-18 | Discharge: 2017-05-18 | Disposition: A | Discharge status: Home / Self Care | Payer: Medicare HMO | Source: Ambulatory Visit | Attending: Internal Medicine | Admitting: Internal Medicine

## 2017-05-18 VITALS — BP 136/76 | HR 90 | Ht 63.5 in | Wt 184.1 lb

## 2017-05-18 DIAGNOSIS — F17210 Nicotine dependence, cigarettes, uncomplicated: Secondary | ICD-10-CM | POA: Diagnosis not present

## 2017-05-18 DIAGNOSIS — I10 Essential (primary) hypertension: Secondary | ICD-10-CM | POA: Diagnosis not present

## 2017-05-18 DIAGNOSIS — R69 Illness, unspecified: Secondary | ICD-10-CM | POA: Diagnosis not present

## 2017-05-18 DIAGNOSIS — R05 Cough: Secondary | ICD-10-CM | POA: Diagnosis not present

## 2017-05-18 DIAGNOSIS — R0902 Hypoxemia: Secondary | ICD-10-CM | POA: Diagnosis not present

## 2017-05-18 DIAGNOSIS — G4734 Idiopathic sleep related nonobstructive alveolar hypoventilation: Secondary | ICD-10-CM | POA: Diagnosis not present

## 2017-05-18 DIAGNOSIS — R058 Other specified cough: Secondary | ICD-10-CM | POA: Insufficient documentation

## 2017-05-18 MED ORDER — LOSARTAN POTASSIUM-HCTZ 100-25 MG PO TABS: 1.0000 | ORAL_TABLET | Freq: Every day | ORAL | 11 refills | Status: DC

## 2017-05-18 NOTE — Patient Instructions (Addendum)
Try off lisinopril and replace it with losartan 100-25 daily in its place   Use as much nasal saline as you need for your nasal congestion    The key is to stop smoking completely before smoking completely stops you!    Please remember to go to the  x-ray department downstairs in the basement  for your tests - we will call you with the results when they are available.      Please schedule a follow up office visit in 6 weeks, call sooner if needed with pfts on return

## 2017-05-18 NOTE — Progress Notes (Signed)
Subjective:     Patient ID: Bonnie Lawson, female   DOB: 1945/12/21,     MRN: [id number]  HPI   81 yowf active smoker from [place] in [place] since mid 90's/ still smoking dx osa around 2008 only used for about a year and no change in symptoms subsequently and had the test repeated 01/01/17 showing snoring>> not sleep apnea but  hypoxemia was documented and referred to pulmonary clinic 05/18/2017 by Dr   Nolon Rod   05/18/2017 1st [facility] office visit/ Gustaf Mccarter   Chief Complaint  Patient presents with  . pulmonary consult    referred by Dr. Nolon Rod due to losing oxygen at night when sleeping.   only inhaler is flonase  Doe = across parking lot from [facility] parking /does  [employer] leaning on cart, use scooter for larger store limited by back legs and breathing = MMRC2 = can't walk a nl pace on a flat grade s sob but does fine slow and flat    Stuffy nose year round worse x  2 y assoc with pnds but s excess/ purulent sputum or mucus plugs     No obvious day to day or daytime variability or assoc  r hemoptysis or cp or chest tightness, subjective wheeze or overt   hb symptoms. No unusual exp hx or h/o childhood pna/ asthma or knowledge of premature birth.  Sleeping ok flat without nocturnal  or early am exacerbation  of respiratory  c/o's or need for noct saba. Also denies any obvious fluctuation of symptoms with weather or environmental changes or other aggravating or alleviating factors except as outlined above   Current Allergies, Complete Past Medical History, Past Surgical History, Family History, and Social History were reviewed in [employer] record.  ROS  The following are not active complaints unless bolded Hoarseness, sore throat, dysphagia, dental problems, itching, sneezing,  nasal congestion or discharge of excess mucus or purulent secretions, ear ache,   fever, chills, sweats, unintended wt loss or wt gain, classically pleuritic or exertional cp,   orthopnea pnd or leg swelling, presyncope, palpitations, abdominal pain, anorexia, nausea, vomiting, diarrhea  or change in bowel habits or change in bladder habits, change in stools or change in urine, dysuria, hematuria,  rash, arthralgias, visual complaints, headache, numbness, weakness or ataxia or problems with walking or coordination,  change in mood/affect or memory.        Current Meds  Medication Sig  . aspirin EC 81 MG tablet Take 81 mg by mouth daily.  . celecoxib (CELEBREX) 200 MG capsule Take 200 mg by mouth 2 (two) times daily.  . cyclobenzaprine (FLEXERIL) 10 MG tablet Take 10 mg by mouth 3 (three) times daily as needed for muscle spasms.  . fluticasone (FLONASE) 50 MCG/ACT nasal spray Place 2 sprays into both nostrils daily.  Marland Kitchen HYDROcodone-acetaminophen (NORCO/VICODIN) 5-325 MG tablet Take 1 tablet by mouth every 6 (six) hours as needed for moderate pain.  Marland Kitchen levothyroxine (SYNTHROID, LEVOTHROID) 75 MCG tablet TAKE 1 TABLET BY MOUTH DAILY BEFORE BREAKFAST WITH NO OTHER MEDS  . omeprazole (PRILOSEC) 20 MG capsule Take 20 mg by mouth daily.  . pregabalin (LYRICA) 150 MG capsule Take 150 mg by mouth 2 (two) times daily.  Marland Kitchen venlafaxine XR (EFFEXOR-XR) 150 MG 24 hr capsule TAKE 1 CAPSULE(150 MG) BY MOUTH TWICE DAILY WITH A MEAL  . [  lisinopril-hydrochlorothiazide (PRINZIDE,ZESTORETIC) 20-25 MG tablet Take 1 tablet by mouth daily.  Review of Systems     Objective:   Physical Exam amb very hoarse wf nad   Wt Readings from Last 3 Encounters:  05/18/17 184 lb 2 oz (83.5 kg)  05/13/17 187 lb (84.8 kg)  04/11/17 192 lb 9.6 oz (87.4 kg)    Vital signs reviewed - Note on arrival 02 sats  94% on RA     HEENT: nl dentition,  and oropharynx. Nl external ear canals without cough reflex - .moderate bilateral non-specific turbinate edema  With mucoid secretions   NECK :  without JVD/Nodes/TM/ nl carotid upstrokes bilaterally   LUNGS: no acc muscle use,  Nl  contour chest which is clear to A and P bilaterally without cough on insp or exp maneuvers   CV:  RRR  no s3 or murmur or increase in P2, and no edema   ABD:  soft and nontender with nl inspiratory excursion in the supine position. No bruits or organomegaly appreciated, bowel sounds nl  MS:  Nl gait/ ext warm without deformities, calf tenderness, cyanosis or clubbing No obvious joint restrictions   SKIN: warm and dry without lesions    NEURO:  alert, approp, nl sensorium with  no motor or cerebellar deficits apparent.       CXR PA and Lateral:   05/18/2017 :    I personally reviewed images and agree with radiology impression as follows:   No active cardiopulmonary disease.        Assessment:

## 2017-05-18 NOTE — Telephone Encounter (Signed)
THIS MESSAGE IS FROM MERRY FROM [facility] IN [place], [place] FOR DR. STALLINGS: THEY HAVE SENT SEVERAL FAXES BECAUSE THEY DON'T HAVE PRESCRIPTIONS ON FILE FOR THE PATIENT. SHE GOT THEM PREVIOUSLY FROM ANOTHER PHARMACY WITH NO REFILLS. SHE NEEDS: 1. FLUTICASONE (FLONASE) 50 MCG/ACT NASAL SPRAY   2. LEVOTHYROXINE 75 MCG TABLET   3. LISINOPRIL /HCTA 20/25 MG   4. OMEPRAZOLE (PRILOSEC) 40 MG (THEIR RECORDS SHOW 40 MG AND NOT 20 MG)   5. VENLAFAXINE XR (EFFEXOR XR) 150 MG 24 HOUR CAPSULE. BEST PHONE [phone] ([facility]) PLEASE ASK FOR MERRY OR ANY ONE WHO ANSWERS. [place]

## 2017-05-18 NOTE — Progress Notes (Signed)
Spoke with pt and notified of results per Dr. Wert. Pt verbalized understanding and denied any questions. 

## 2017-05-19 NOTE — Telephone Encounter (Signed)
Spoke with pharmacy advised all prescription except lisinopril and prilosec were called int [facility] on 9-24 they will call to get these transferred to them.

## 2017-05-19 NOTE — Assessment & Plan Note (Signed)
In the best review of chronic cough to date ( NEJM 2016 375 [phone]) ,  ACEi are now felt to cause cough in up to  20% of pts which is a 4 fold increase from previous reports and does not include the variety of non-specific complaints we see in pulmonary clinic in pts on ACEi but previously attributed to another dx like  Copd/asthma and  include PNDS, throat and chest congestion, "bronchitis", unexplained dyspnea and noct "strangling" sensations and, I suspect, not desats due to upper airway issues aggravated by elevated bradykinins, and hoarseness, but also  atypical /refractory GERD symptoms like dysphagia and "bad heartburn"   The only way I know  to prove this is not an "ACEi Case" is a trial off ACEi x a minimum of 6 weeks then regroup  Try losartan 100-25 x 6weeks then return to regroup with pfts on return .

## 2017-05-19 NOTE — Assessment & Plan Note (Signed)

## 2017-05-19 NOTE — Assessment & Plan Note (Addendum)
Upper airway cough syndrome (previously labeled PNDS),  is so named because it's frequently impossible to sort out how much is  CR/sinusitis with freq throat clearing (which can be related to primary GERD)   vs  causing  secondary (" extra esophageal")  GERD from wide swings in gastric pressure that occur with throat clearing, often  promoting self use of mint and menthol lozenges that reduce the lower esophageal sphincter tone and exacerbate the problem further in a cyclical fashion.   These are the same pts (now being labeled as having "irritable larynx syndrome" by some cough centers) who not infrequently have a history of having failed to tolerate ace inhibitors,  dry powder inhalers or biphosphonates or report having atypical/extraesophageal reflux symptoms that don't respond to standard doses of PPI  and are easily confused as having aecopd or asthma flares by even experienced allergists/ pulmonologists (myself included).    Before going further in w/u needs trial off acei x 6 weeks then return for pfts   Total time devoted to counseling  > 50 % of initial 60 min office visit:  review case with pt/ discussion of options/alternatives/ personally creating written customized instructions  in presence of pt  then going over those specific  Instructions directly with the pt including how to use all of the meds but in particular covering each new medication in detail and the difference between the maintenance= "automatic" meds and the prns using an action plan format for the latter (If this problem/symptom => do that organization reading Left to right).  Please see AVS from this visit for a full list of these instructions which I personally wrote for this pt and  are unique to this visit.

## 2017-05-19 NOTE — Assessment & Plan Note (Signed)
Diagnosed in the 2000s but noncompliant for over 10 years because she would fall asleep before making it to her bedroom.  - repeat split night 01/01/17 :  Moderate oxygen desaturation was noted during this study (Min  O2 = 81.00%. Mean 89.3%).   rec trial off cigs/ acei then repeat ono on RA

## 2017-05-22 ENCOUNTER — Encounter (INDEPENDENT_AMBULATORY_CARE_PROVIDER_SITE_OTHER): Payer: Self-pay | Admitting: Orthopaedic Surgery

## 2017-05-22 ENCOUNTER — Ambulatory Visit (INDEPENDENT_AMBULATORY_CARE_PROVIDER_SITE_OTHER): Payer: Medicare HMO | Admitting: Orthopaedic Surgery

## 2017-05-22 VITALS — BP 143/87 | HR 74 | Ht 63.5 in | Wt 184.0 lb

## 2017-05-22 DIAGNOSIS — M5442 Lumbago with sciatica, left side: Secondary | ICD-10-CM | POA: Diagnosis not present

## 2017-05-22 DIAGNOSIS — G8929 Other chronic pain: Secondary | ICD-10-CM | POA: Diagnosis not present

## 2017-05-22 NOTE — Progress Notes (Signed)
Office Visit Note   Patient: Bonnie Lawson           Date of Birth: 1946/04/02           MRN: [id number] Visit Date: 05/22/2017              Requested by: Forrest Moron, MD [place], [place] [id number] PCP: Forrest Moron, MD   Assessment & Plan: Visit Diagnoses:  1. Chronic left-sided low back pain with left-sided sciatica     L3-for recurrent disc with left leg symptoms.  Plan: We'll set her up for single epidural injection. We discussed walking program weight loss smoking cessation. Recheck 6 weeks.  Follow-Up Instructions: Return in about 6 weeks (around 105/25/202018).   Orders:  Orders Placed This Encounter  Procedures  . Ambulatory referral to Physical Medicine Rehab   No orders of the defined types were placed in this encounter.     Procedures: No procedures performed   Clinical Data: No additional findings.   Subjective: Chief Complaint  Patient presents with  . Lower Back - Pain    HPI 71 year old originally from [place] who is had lumbar surgery around 2006 by [facility] orthopedics. She complains of chronic back pain that radiates down left leg. She states her foot feels like it's on fire. She denies numbness or tingling. Pain is been worse the last 2 weeks. Significant pain has been present for 4-5 months. She's used hydrocodone when necessary Tylenol Extra Strength and Tylenol arthritis. Chronic smoker with nocturnal hypoxia. Cigarette smoker with chronic upper airway cough syndrome, insomnia uncontrolled hypertension and back and left leg pain. Previous surgery on the left at L3-4 with recent MRI 05/07/2007 showing small disc extrusion contributing to some left L4 nerve root encroachment similar to abdominal CT scan 1 year ago.  Review of Systems 14 point review of systems positive for acid reflux, anxiety, depression, hypertension, history of scarlet fever, sleep apnea, thyroid condition, ulcers, nocturnal hypoxia. Previous tonsillectomy  surgery for deviated nasal septum, hysterectomy ankle fracture, lumbar surgery L3-4 left, perforated ulcer. Otherwise negative as pertains history of present illness.   Objective: Vital Signs: BP (!) 143/87   Pulse 74   Ht 5' 3.5" (1.613 m)   Wt 184 lb (83.5 kg)   BMI 32.08 kg/m   Physical Exam  Constitutional: She is oriented to person, place, and time. She appears well-developed.  HENT:  Head: Normocephalic.  Right Ear: External ear normal.  Left Ear: External ear normal.  Eyes: Pupils are equal, round, and reactive to light.  Neck: No tracheal deviation present. No thyromegaly present.  Cardiovascular: Normal rate.   Pulmonary/Chest: Effort normal.  Abdominal: Soft.  Neurological: She is alert and oriented to person, place, and time.  Skin: Skin is warm and dry.  Psychiatric: She has a normal mood and affect. Her behavior is normal.    Ortho Exam patient has some pain with sciatic palpation of the left negative straight leg raising 90. Quad anterior tib EHL is strong. Distal pulses are palpable. Her incision is well-healed. No erythema and no drainage. Good hip range of motion without discomfort.   Specialty Comments:  No specialty comments available.  Imaging: Contrast  Study Result   CLINICAL DATA:  Low back and left leg pain. No recent injury. History of microdiscectomy at L3-4.  EXAM: MRI LUMBAR SPINE WITHOUT CONTRAST  TECHNIQUE: Multiplanar, multisequence MR imaging of the lumbar spine was performed. No intravenous contrast was administered.  COMPARISON:  Abdominopelvic  CT 04/23/2016. Report from lumbar MRI 06/29/2014.  FINDINGS: Segmentation: Conventional anatomy assumed, with the last open disc space designated L5-S1.  Alignment:  Normal.  Vertebrae: No worrisome osseous lesion, acute fracture or pars defect. Endplate degenerative changes are present throughout the lumbar spine, most advanced at L4-5. There is a T12 hemangioma. The visualized  sacroiliac joints appear unremarkable.  Conus medullaris: Extends to the L1-2 level and appears normal.  Paraspinal and other soft tissues: No significant paraspinal findings. Multiple gallstones are noted.  Disc levels:  From T10-11 through L1-2, there is disc bulging and endplate osteophyte formation, but no resulting spinal stenosis or nerve root encroachment.  L2-3: There is disc bulging with a broad-based left foraminal disc protrusion. There is mild facet and ligamentous hypertrophy. There is mild left foraminal narrowing and possible left L2 nerve root encroachment. The spinal canal and lateral recesses are adequately patent.  L3-4: There is a left laminectomy defect with adequate decompression of the spinal canal. There is loss of disc height with annular disc bulging and endplate osteophytes. There is small disc extrusion within the left subarticular zone, containing vacuum phenomenon on prior CT. This contributes to asymmetric narrowing of the left lateral recess and possible left L4 nerve root encroachment. There is also mild inferior foraminal narrowing on the left without definite L3 nerve root encroachment.  L4-5: Chronic degenerative disc disease with loss of disc height, annular disc bulging and endplate osteophytes, asymmetric to the right. Mild facet and ligamentous hypertrophy. There is mild right foraminal narrowing. No evidence of left-sided nerve root encroachment.  L5-S1: Chronic degenerative disc disease with loss of disc height, annular disc bulging and endplate osteophytes. Mild bilateral facet hypertrophy. Mild foraminal narrowing bilaterally without definite nerve root encroachment.  IMPRESSION: 1. Prior postoperative lumbar MRI is not available for direct comparison. 2. Postsurgical changes on the left at L3-4. There is a small disc extrusion in the left subarticular zone which may contribute to left L4 nerve root encroachment.  Appearance is similar to abdominal CT from 1 year ago. 3. Multilevel disc and endplate degeneration contributing to mild foraminal narrowing at several levels, asymmetric to the left at L2-3 and L3-4.   Electronically Signed   By: Richardean Sale M.D.   On: 05/06/2017 16:47       PMFS History: Patient Active Problem List   Diagnosis Date Noted  . Chronic left-sided low back pain with left-sided sciatica 05/25/2017  . Upper airway cough syndrome 05/18/2017  . Nocturnal hypoxemia 11/06/2016  . Moderate depressive episode ([place]) 11/06/2016  . Menopause 11/06/2016  . Cigarette smoker 11/06/2016  . Uncontrolled hypertension 11/05/2016  . Abdominal pain, chronic, epigastric 04/25/2016  . Abdominal bloating 04/25/2016  . Leukocytosis 04/25/2016  . Insomnia 04/25/2016  . Essential hypertension 04/25/2016  . Chronic pain 04/25/2016   Past Medical History:  Diagnosis Date  . Allergy     Family History  Problem Relation Age of Onset  . Stroke Neg Hx     Past Surgical History:  Procedure Laterality Date  . NO PAST SURGERIES     Social History   Occupational History  . Not on file.   Social History Main Topics  . Smoking status: Current Some Day Smoker    Packs/day: 0.75    Years: 37.00  . Smokeless tobacco: Current User  . Alcohol use No  . Drug use: No  . Sexual activity: Not on file

## 2017-05-25 DIAGNOSIS — M5442 Lumbago with sciatica, left side: Principal | ICD-10-CM

## 2017-05-25 DIAGNOSIS — G8929 Other chronic pain: Secondary | ICD-10-CM | POA: Insufficient documentation

## 2017-06-08 ENCOUNTER — Ambulatory Visit (INDEPENDENT_AMBULATORY_CARE_PROVIDER_SITE_OTHER): Payer: Medicare HMO | Admitting: Physical Medicine and Rehabilitation

## 2017-06-08 ENCOUNTER — Encounter (INDEPENDENT_AMBULATORY_CARE_PROVIDER_SITE_OTHER): Payer: Self-pay | Admitting: Physical Medicine and Rehabilitation

## 2017-06-08 ENCOUNTER — Ambulatory Visit (INDEPENDENT_AMBULATORY_CARE_PROVIDER_SITE_OTHER): Payer: Self-pay

## 2017-06-08 VITALS — BP 146/98 | HR 97

## 2017-06-08 DIAGNOSIS — M5416 Radiculopathy, lumbar region: Secondary | ICD-10-CM | POA: Diagnosis not present

## 2017-06-08 MED ORDER — BETAMETHASONE SOD PHOS & ACET 6 (3-3) MG/ML IJ SUSP
12.0000 mg | Freq: Once | INTRAMUSCULAR | Status: AC
  Administered 2017-06-08: 12 mg

## 2017-06-08 MED ORDER — LIDOCAINE HCL (PF) 1 % IJ SOLN
2.0000 mL | Freq: Once | INTRAMUSCULAR | Status: AC
  Administered 2017-06-08: 2 mL

## 2017-06-08 NOTE — Progress Notes (Deleted)
Chronic left-sided low back pain with left-sided sciatica

## 2017-06-08 NOTE — Patient Instructions (Signed)

## 2017-06-09 NOTE — Procedures (Signed)
Bonnie Lawson is a 71 year old female with prior lumbar surgery with MRI evidence of recurrent disc herniation at L3-4 likely affecting the lateral recess and L4 nerve root. She is followed by Dr. Lorin Mercy requested epidural injection. We're to complete a left L3 and L4 transforaminal epidural steroid injection.The injection  will be diagnostic and hopefully therapeutic. The patient has failed conservative care including time, medications and activity modification.  Lumbosacral Transforaminal Epidural Steroid Injection - Sub-Pedicular Approach with Fluoroscopic Guidance  Patient: Bonnie Lawson      Date of Birth: 1945/11/15 MRN: [id number] PCP: Forrest Moron, MD      Visit Date: 06/08/2017   Universal Protocol:    Date/Time: 06/08/2017  Consent Given By: the patient  Position: PRONE  Additional Comments: Vital signs were monitored before and after the procedure. Patient was prepped and draped in the usual sterile fashion. The correct patient, procedure, and site was verified.   Injection Procedure Details:  Procedure Site One Meds Administered:  Meds ordered this encounter  Medications  . lidocaine (PF) (XYLOCAINE) 1 % injection 2 mL  . betamethasone acetate-betamethasone sodium phosphate (CELESTONE) injection 12 mg    Laterality: Left  Location/Site:  L3-L4 L4-L5  Needle size: 22 G  Needle type: Spinal  Needle Placement: Transforaminal  Findings:  -Contrast Used: 1 mL iohexol 180 mg iodine/mL   -Comments: Excellent flow of contrast along the nerve and into the epidural space.  Procedure Details: After squaring off the end-plates to get a true AP view, the C-arm was positioned so that an oblique view of the foramen as noted above was visualized. The target area is just inferior to the "nose of the scotty dog" or sub pedicular. The soft tissues overlying this structure were infiltrated with 2-3 ml. of 1% Lidocaine without Epinephrine.  The spinal needle was inserted  toward the target using a "trajectory" view along the fluoroscope beam.  Under AP and lateral visualization, the needle was advanced so it did not puncture dura and was located close the 6 O'Clock position of the pedical in AP tracterory. Biplanar projections were used to confirm position. Aspiration was confirmed to be negative for CSF and/or blood. A 1-2 ml. volume of Isovue-250 was injected and flow of contrast was noted at each level. Radiographs were obtained for documentation purposes.   After attaining the desired flow of contrast documented above, a 0.5 to 1.0 ml test dose of 0.25% Marcaine was injected into each respective transforaminal space.  The patient was observed for 90 seconds post injection.  After no sensory deficits were reported, and normal lower extremity motor function was noted,   the above injectate was administered so that equal amounts of the injectate were placed at each foramen (level) into the transforaminal epidural space.   Additional Comments:  The patient tolerated the procedure well Dressing: Band-Aid    Post-procedure details: Patient was observed during the procedure. Post-procedure instructions were reviewed.  Patient left the clinic in stable condition.

## 2017-06-19 ENCOUNTER — Telehealth: Payer: Self-pay | Admitting: Family Medicine

## 2017-06-19 NOTE — Telephone Encounter (Signed)
Pt is needing a refill on lyrica and she is completely out and states that the last time she and dr Nolon Rod talked she talked about an increase in miligrams  Best number [phone]

## 2017-06-22 ENCOUNTER — Telehealth: Payer: Self-pay

## 2017-06-22 MED ORDER — PREGABALIN 300 MG PO CAPS: 300.0000 mg | ORAL_CAPSULE | Freq: Two times a day (BID) | ORAL | 3 refills | Status: DC

## 2017-06-22 NOTE — Telephone Encounter (Signed)
Medication refilled. Dose increased to 300mg  to take. Please notify the patient.

## 2017-06-22 NOTE — Telephone Encounter (Signed)
Prescription for lyrica 300 mg #60 with no refills sent via fax to [phone].  Confirmation fax received 6:01 p.m. Dgaddy, CMA

## 2017-06-22 NOTE — Telephone Encounter (Signed)
Please advise 

## 2017-06-23 ENCOUNTER — Telehealth: Payer: Self-pay | Admitting: Family Medicine

## 2017-06-23 ENCOUNTER — Other Ambulatory Visit: Payer: Self-pay | Admitting: *Deleted

## 2017-06-23 MED ORDER — PREGABALIN 300 MG PO CAPS: 300.0000 mg | ORAL_CAPSULE | Freq: Two times a day (BID) | ORAL | 3 refills | Status: DC

## 2017-06-23 NOTE — Telephone Encounter (Signed)
Spoke to pt who clarified that her Lyrica refill was supposed to be changed to 200mg  BID d/t increased symptoms.

## 2017-06-23 NOTE — Telephone Encounter (Signed)
Copied from [place] [id number]. Topic: Quick Communication - See Telephone Encounter >> Jun 23, 2017  9:19 AM Burnis Medin, NT wrote: CRM for notification. See Telephone encounter for: Pt. Called in again about wanting to getting a prescription for pregabalin (LYRICA) 300 MG capsule. Pt. Is out of medication and would like to pick the prescription up today.  06/23/17.

## 2017-06-23 NOTE — Telephone Encounter (Signed)
Please disregard separate message regarding refill request.  Pt has been notified that Rx has been refilled for 300mg  BID.

## 2017-06-29 ENCOUNTER — Ambulatory Visit (INDEPENDENT_AMBULATORY_CARE_PROVIDER_SITE_OTHER): Payer: Medicare HMO | Admitting: Internal Medicine

## 2017-06-29 ENCOUNTER — Other Ambulatory Visit (INDEPENDENT_AMBULATORY_CARE_PROVIDER_SITE_OTHER): Payer: Medicare HMO

## 2017-06-29 ENCOUNTER — Encounter: Payer: Self-pay | Admitting: Internal Medicine

## 2017-06-29 ENCOUNTER — Ambulatory Visit: Payer: Medicare HMO | Admitting: Internal Medicine

## 2017-06-29 VITALS — BP 122/80 | HR 80 | Ht 62.75 in | Wt 184.0 lb

## 2017-06-29 DIAGNOSIS — R058 Other specified cough: Secondary | ICD-10-CM

## 2017-06-29 DIAGNOSIS — G4734 Idiopathic sleep related nonobstructive alveolar hypoventilation: Secondary | ICD-10-CM

## 2017-06-29 DIAGNOSIS — R05 Cough: Secondary | ICD-10-CM | POA: Diagnosis not present

## 2017-06-29 DIAGNOSIS — F17210 Nicotine dependence, cigarettes, uncomplicated: Secondary | ICD-10-CM

## 2017-06-29 DIAGNOSIS — R059 Cough, unspecified: Secondary | ICD-10-CM

## 2017-06-29 DIAGNOSIS — I10 Essential (primary) hypertension: Secondary | ICD-10-CM | POA: Diagnosis not present

## 2017-06-29 DIAGNOSIS — R69 Illness, unspecified: Secondary | ICD-10-CM | POA: Diagnosis not present

## 2017-06-29 LAB — PULMONARY FUNCTION TEST
DL/VA % PRED: 71 %
DL/VA: 3.3 ml/min/mmHg/L
DLCO cor % pred: 64 %
DLCO cor: 14.58 ml/min/mmHg
DLCO unc % pred: 62 %
DLCO unc: 14.01 ml/min/mmHg
FEF 25-75 POST: 1.33 L/s
FEF 25-75 PRE: 1.29 L/s
FEF2575-%CHANGE-POST: 2 %
FEF2575-%PRED-POST: 75 %
FEF2575-%PRED-PRE: 72 %
FEV1-%Change-Post: 0 %
FEV1-%PRED-PRE: 86 %
FEV1-%Pred-Post: 85 %
FEV1-POST: 1.81 L
FEV1-Pre: 1.82 L
FEV1FVC-%CHANGE-POST: 4 %
FEV1FVC-%PRED-PRE: 98 %
FEV6-%CHANGE-POST: -5 %
FEV6-%PRED-POST: 86 %
FEV6-%Pred-Pre: 90 %
FEV6-Post: 2.31 L
FEV6-Pre: 2.43 L
FEV6FVC-%CHANGE-POST: 0 %
FEV6FVC-%Pred-Post: 105 %
FEV6FVC-%Pred-Pre: 104 %
FVC-%CHANGE-POST: -5 %
FVC-%PRED-POST: 82 %
FVC-%Pred-Pre: 87 %
FVC-POST: 2.31 L
FVC-Pre: 2.44 L
POST FEV1/FVC RATIO: 78 %
PRE FEV1/FVC RATIO: 75 %
Post FEV6/FVC ratio: 100 %
Pre FEV6/FVC Ratio: 100 %
RV % pred: 144 %
RV: 3.11 L
TLC % PRED: 120 %
TLC: 5.84 L

## 2017-06-29 LAB — CBC WITH DIFFERENTIAL/PLATELET
BASOS ABS: 0.1 10*3/uL (ref 0.0–0.1)
BASOS PCT: 0.8 % (ref 0.0–3.0)
EOS ABS: 0.1 10*3/uL (ref 0.0–0.7)
Eosinophils Relative: 1.4 % (ref 0.0–5.0)
HCT: 42.2 % (ref 36.0–46.0)
Hemoglobin: 14 g/dL (ref 12.0–15.0)
LYMPHS ABS: 3.2 10*3/uL (ref 0.7–4.0)
Lymphocytes Relative: 33 % (ref 12.0–46.0)
MCHC: 33.3 g/dL (ref 30.0–36.0)
MCV: 86.7 fl (ref 78.0–100.0)
MONOS PCT: 6.6 % (ref 3.0–12.0)
Monocytes Absolute: 0.6 10*3/uL (ref 0.1–1.0)
NEUTROS ABS: 5.7 10*3/uL (ref 1.4–7.7)
NEUTROS PCT: 58.2 % (ref 43.0–77.0)
PLATELETS: 301 10*3/uL (ref 150.0–400.0)
RBC: 4.86 Mil/uL (ref 3.87–5.11)
RDW: 15 % (ref 11.5–15.5)
WBC: 9.8 10*3/uL (ref 4.0–10.5)

## 2017-06-29 NOTE — Progress Notes (Signed)
PFT done today. 

## 2017-06-29 NOTE — Assessment & Plan Note (Signed)
Diagnosed in the 2000s but noncompliant for over 10 years because she would fall asleep before making it to her bedroom.  - repeat split night 01/01/17 :  Moderate oxygen desaturation was noted during this study (Min  O2 = 81.00%. Mean 89.3%).  No symptoms correlating at this point, work on upper airway and f/u prn symptoms of ha or complications related to desats which are lacking at present

## 2017-06-29 NOTE — Progress Notes (Signed)
Subjective:     Patient ID: Bonnie Lawson, female   DOB: 08-09-1946,     MRN: [id number]     Brief patient profile:  41 yowf active smoker from [place] in [place] since mid 90's/ still smoking dx osa around 2008 only used cpap for about a year and no change in symptoms subsequently and had the test repeated 01/01/17 showing snoring>> not sleep apnea but  hypoxemia was documented and referred to pulmonary clinic 05/18/2017 by Dr   Nolon Rod   History of Present Illness  05/18/2017 1st [facility] office visit/ Moya Duan   Chief Complaint  Patient presents with  . pulmonary consult    referred by Dr. Nolon Rod due to losing oxygen at night when sleeping.  only inhaler is flonase  Doe = across parking lot from [facility] parking /does  [employer] leaning on cart, use scooter for larger store limited by back legs and breathing = MMRC2 = can't walk a nl pace on a flat grade s sob but does fine slow and flat   Stuffy nose year round worse x  2 y assoc with pnds but s excess/ purulent sputum or mucus plugs  rec Try off lisinopril and replace it with losartan 100-25 daily in its place  Use as much nasal saline as you need for your nasal congestion   The key is to stop smoking completely before smoking completely stops you!  Please remember to go to the  x-ray department downstairs in the basement  for your tests - we will call you with the results when they are available.     06/29/2017  f/u ov/Jarvis Knodel re:   doe/cough better off acei /  noct desats / still smoking  Chief Complaint  Patient presents with  . Follow-up    PFT's done today. Her breathing is unchanged. No new co's.    no AM h/a ,  No excess drowsiness reported All good x excess nasal congestion x 2 years better with saline nasal irrigation   No obvious day to day or daytime variability or assoc excess/ purulent sputum or mucus plugs or hemoptysis or cp or chest tightness, subjective wheeze or overt sinus or hb symptoms. No unusual exp hx or  h/o childhood pna/ asthma or knowledge of premature birth.  Sleeping ok flat without nocturnal  or early am exacerbation  of respiratory  c/o's or need for noct saba. Also denies any obvious fluctuation of symptoms with weather or environmental changes or other aggravating or alleviating factors except as outlined above   Current Allergies, Complete Past Medical History, Past Surgical History, Family History, and Social History were reviewed in [employer] record.  ROS  The following are not active complaints unless bolded Hoarseness, sore throat, dysphagia, dental problems, itching, sneezing,  nasal congestion or discharge of excess mucus or purulent secretions, ear ache,   fever, chills, sweats, unintended wt loss or wt gain, classically pleuritic or exertional cp,  orthopnea pnd or leg swelling, presyncope, palpitations, abdominal pain, anorexia, nausea, vomiting, diarrhea  or change in bowel habits or change in bladder habits, change in stools or change in urine, dysuria, hematuria,  rash, arthralgias, visual complaints, headache, numbness, weakness or ataxia or problems with walking or coordination,  change in mood/affect or memory.        Current Meds  Medication Sig  . aspirin EC 81 MG tablet Take 81 mg by mouth daily.  . celecoxib (CELEBREX) 200 MG capsule Take 200 mg by mouth 2 (  two) times daily.  . cyclobenzaprine (FLEXERIL) 10 MG tablet Take 10 mg by mouth 3 (three) times daily as needed for muscle spasms.  . fluticasone (FLONASE) 50 MCG/ACT nasal spray Place 2 sprays into both nostrils daily.  Marland Kitchen HYDROcodone-acetaminophen (NORCO/VICODIN) 5-325 MG tablet Take 1 tablet by mouth every 6 (six) hours as needed for moderate pain.  Marland Kitchen levothyroxine (SYNTHROID, LEVOTHROID) 75 MCG tablet TAKE 1 TABLET BY MOUTH DAILY BEFORE BREAKFAST WITH NO OTHER MEDS  . losartan-hydrochlorothiazide (HYZAAR) 100-25 MG tablet Take 1 tablet by mouth daily.  Marland Kitchen omeprazole (PRILOSEC) 20 MG  capsule Take 20 mg by mouth daily.  . pregabalin (LYRICA) 300 MG capsule Take 1 capsule (300 mg total) 2 (two) times daily by mouth.  . venlafaxine XR (EFFEXOR-XR) 150 MG 24 hr capsule TAKE 1 CAPSULE(150 MG) BY MOUTH TWICE DAILY WITH A MEAL                   Objective:   Physical Exam  amb wf nad     06/29/2017     184   05/18/17 184 lb 2 oz (83.5 kg)  05/13/17 187 lb (84.8 kg)  04/11/17 192 lb 9.6 oz (87.4 kg)    Vital signs reviewed - Note on arrival 02 sats  94% on RA  And BP 122/80    HEENT: nl dentition,  and oropharynx. Nl external ear canals without cough reflex - mild  bilateral non-specific turbinate edema  = Modified Mallampati Score =   1- 2    NECK :  without JVD/Nodes/TM/ nl carotid upstrokes bilaterally   LUNGS: no acc muscle use,  Nl contour chest which is clear to A and P bilaterally without cough on insp or exp maneuvers   CV:  RRR  no s3 or murmur or increase in P2, and no edema   ABD:  soft and nontender with nl inspiratory excursion in the supine position. No bruits or organomegaly appreciated, bowel sounds nl  MS:  Nl gait/ ext warm without deformities, calf tenderness, cyanosis or clubbing No obvious joint restrictions   SKIN: warm and dry without lesions    NEURO:  alert, approp, nl sensorium with  no motor or cerebellar deficits apparent.      CXR PA and Lateral:   05/18/2017 :    I personally reviewed images and agree with radiology impression as follows:   No active cardiopulmonary disease.    Labs ordered 06/29/2017   Allergy profile     Assessment:

## 2017-06-29 NOTE — Assessment & Plan Note (Signed)
Adequate control on present rx, reviewed in detail with pt > no change in rx needed    Although even in retrospect it may not be clear the ACEi contributed to the pt's symptoms,  Pt improved off them and adding them back at this point or in the future would risk confusion in interpretation of non-specific respiratory symptoms to which this patient is prone  ie  Better not to muddy the waters here.  

## 2017-06-29 NOTE — Patient Instructions (Addendum)
The key is to stop smoking completely before smoking completely stops you - it's not too late!!!   If you get worse daytime sleepiness or start waking up regularly with headache>>> please call me for overnight oximetry to see if you need night time 02    Please see patient coordinator before you leave today  to schedule sinus CT   Please remember to go to the lab department downstairs in the basement  for your tests - we will call you with the results when they are available.        If you are satisfied with your treatment plan,  let your doctor know and he/she can either refill your medications or you can return here when your prescription runs out.     If in any way you are not 100% satisfied,  please tell us.  If 100% better, tell your friends!  Pulmonary follow up is as needed

## 2017-06-29 NOTE — Assessment & Plan Note (Addendum)
-   PFT's  06/29/2017  FEV1 1.81  (85 % ) ratio 78   p no  % improvement from saba p  nothing prior to study with DLCO  62/64  % corrects to 71 % for alv volume  And ERV 45    > 3 min discussion I reviewed the Fletcher curve with the patient that basically indicates  if you quit smoking when your best day FEV1 is still well preserved (as is clearly  the case here)  it is highly unlikely you will progress to severe disease and informed the patient there was  no medication on the market that has proven to alter the curve/ its downward trajectory  or the likelihood of progression of their disease(unlike other chronic medical conditions such as atheroclerosis where we do think we can change the natural hx with risk reducing meds)    Therefore stopping smoking and maintaining abstinence is the most important aspect of care, not choice of inhalers or for that matter, doctors.

## 2017-06-29 NOTE — Assessment & Plan Note (Signed)
Trial off acei 05/18/2017 >>>  Improved 06/29/2017  - Sinus CT 06/29/2017 >>>  - Allergy profile 06/29/2017 >  Eos 0. /  IgE    Only remaining complaint is chronic nasal congestion x 2 years > complete w/u as above/ f/u ent vs allergy if indicated/ avoid acei  indef (see separate a/p)

## 2017-06-30 LAB — RESPIRATORY ALLERGY PROFILE REGION II ~~LOC~~
Allergen, Cedar tree, t12: <0.1 kU/L
Allergen, Cottonwood, t14: <0.1 kU/L
Allergen, Mulberry, t76: <0.1 kU/L
Allergen, P. notatum, m1: <0.1 kU/L
Bermuda Grass: <0.1 kU/L
CLADOSPORIUM HERBARUM (M2) IGE: <0.1 kU/L
CLASS: 0
CLASS: 0
CLASS: 0
CLASS: 0
CLASS: 0
CLASS: 0
CLASS: 0
CLASS: 0
CLASS: 0
CLASS: 0
CLASS: 0
COMMON RAGWEED (SHORT) (W1) IGE: <0.1 kU/L
Cat Dander: <0.1 kU/L
Class: 0
Class: 0
Class: 0
Class: 0
Class: 0
Class: 0
Class: 0
Class: 0
Class: 0
Class: 0
Class: 0
Class: 0
Class: 0
Cockroach: <0.1 kU/L
D. farinae: <0.1 kU/L
Dog Dander: <0.1 kU/L
IgE (Immunoglobulin E), Serum: 23 kU/L (ref <=114)

## 2017-06-30 LAB — INTERPRETATION:

## 2017-07-01 ENCOUNTER — Telehealth: Payer: Self-pay | Admitting: Internal Medicine

## 2017-07-01 NOTE — Progress Notes (Signed)
LMTCB

## 2017-07-01 NOTE — Telephone Encounter (Signed)
Notes recorded by Tanda Rockers, MD on 07/01/2017 at 5:51 AM EST Call patient : Studies are unremarkable, no change in recs  Spoke with patient. She is aware of results. Nothing else needed at time of call.

## 2017-07-06 ENCOUNTER — Encounter (INDEPENDENT_AMBULATORY_CARE_PROVIDER_SITE_OTHER): Payer: Self-pay | Admitting: Orthopaedic Surgery

## 2017-07-06 ENCOUNTER — Ambulatory Visit (INDEPENDENT_AMBULATORY_CARE_PROVIDER_SITE_OTHER): Payer: Medicare HMO | Admitting: Orthopaedic Surgery

## 2017-07-06 VITALS — BP 166/111 | HR 84 | Ht 62.5 in | Wt 184.0 lb

## 2017-07-06 DIAGNOSIS — G629 Polyneuropathy, unspecified: Secondary | ICD-10-CM | POA: Diagnosis not present

## 2017-07-06 NOTE — Progress Notes (Signed)
Office Visit Note   Patient: Bonnie Lawson           Date of Birth: 05/03/1946           MRN: [id number] Visit Date: 07/06/2017              Requested by: Forrest Moron, MD [place], [place] [id number] PCP: Forrest Moron, MD   Assessment & Plan: Visit Diagnoses:  1. Neuropathy   History of remote left ankle fracture  Plan: Since patient not have any relief with previous injection and with her history of left ankle fracture will schedule NCV/EMG study to rule out peripheral Neuropathy versus lumbar radiculopathy. Patient refers to have this done after the Christmas holiday. Follow-up with Dr. Lorin Mercy after completion of the study to discuss results.  Follow-Up Instructions: Return in about 2 months (around 09/05/2017) for to review NCV/EMG study with Yates.   Orders:  No orders of the defined types were placed in this encounter.  No orders of the defined types were placed in this encounter.     Procedures: No procedures performed   Clinical Data: No additional findings.   Subjective: Chief Complaint  Patient presents with  . Lower Back - Pain, Follow-up    HPI Patient returns after having left L3-4 and at L4-5 injections with Dr. Ernestina Patches. States that her back pain is better but she still continues to have the burning pain in her left ankle/foot and also mid left tibia. Patient states that this is been ongoing for several years and started after she had a left ankle fracture. Fracture was several years ago and treated by Dr. Weber Cooks who was with [facility] orthopedics. States that Dr. Beola Cord started her on Lyrica several years ago. She has also been on gabapentin in the past.  Lyrica does help to control the burning at times. No leg weakness. Review of Systems No current cardiac pulmonary GI GU issues  Objective: Vital Signs: BP (!) 166/111   Pulse 84   Ht 5' 2.5" (1.588 m)   Wt 184 lb (83.5 kg)   BMI 33.12 kg/m   Physical Exam  Constitutional:  She is oriented to person, place, and time. She appears well-developed. No distress.  HENT:  Head: Normocephalic and atraumatic.  Eyes: EOM are normal. Pupils are equal, round, and reactive to light.  Pulmonary/Chest: No respiratory distress.  Musculoskeletal:  Negative logroll bilateral hips. Negative straight leg raise. Calves nontender. Bilateral ankles nontender. Neurologically intact.  Neurological: She is alert and oriented to person, place, and time.  Skin: Skin is warm and dry.  Psychiatric: She has a normal mood and affect.    Ortho Exam  Specialty Comments:  No specialty comments available.  Imaging: No results found.   PMFS History: Patient Active Problem List   Diagnosis Date Noted  . Chronic left-sided low back pain with left-sided sciatica 05/25/2017  . Upper airway cough syndrome 05/18/2017  . Nocturnal hypoxemia 11/06/2016  . Moderate depressive episode ([place]) 11/06/2016  . Menopause 11/06/2016  . Cigarette smoker 11/06/2016  . Uncontrolled hypertension 11/05/2016  . Abdominal pain, chronic, epigastric 04/25/2016  . Abdominal bloating 04/25/2016  . Leukocytosis 04/25/2016  . Insomnia 04/25/2016  . Essential hypertension 04/25/2016  . Chronic pain 04/25/2016   Past Medical History:  Diagnosis Date  . Allergy     Family History  Problem Relation Age of Onset  . Stroke Neg Hx     Past Surgical History:  Procedure Laterality Date  .  NO PAST SURGERIES     Social History   Occupational History  . Not on file  Tobacco Use  . Smoking status: Current Every Day Smoker    Packs/day: 0.75    Years: 37.00    Pack years: 27.75  . Smokeless tobacco: Never Used  Substance and Sexual Activity  . Alcohol use: No  . Drug use: No  . Sexual activity: Not on file

## 2017-07-07 ENCOUNTER — Ambulatory Visit (INDEPENDENT_AMBULATORY_CARE_PROVIDER_SITE_OTHER): Payer: Medicare HMO | Admitting: Orthopaedic Surgery

## 2017-07-08 ENCOUNTER — Ambulatory Visit (INDEPENDENT_AMBULATORY_CARE_PROVIDER_SITE_OTHER)
Admission: RE | Admit: 2017-07-08 | Discharge: 2017-07-08 | Disposition: A | Discharge status: Home / Self Care | Payer: Medicare HMO | Source: Ambulatory Visit | Attending: Internal Medicine | Admitting: Internal Medicine

## 2017-07-08 DIAGNOSIS — R05 Cough: Secondary | ICD-10-CM

## 2017-07-08 DIAGNOSIS — R0981 Nasal congestion: Secondary | ICD-10-CM | POA: Diagnosis not present

## 2017-07-08 DIAGNOSIS — R058 Other specified cough: Secondary | ICD-10-CM

## 2017-07-08 DIAGNOSIS — R059 Cough, unspecified: Secondary | ICD-10-CM

## 2017-07-28 NOTE — Progress Notes (Signed)
Chief Complaint  Patient presents with  . Follow-up    6 month f/u htn, needs 3 month supply refill on lyrica and woke yesteday morning with stiff neck with pain and discomfort    HPI  New Problem- Torticollis Pt reports that she woke up this morning with neck stiffness out of the blue She reports that she did not sleep in a new bed or try any new pillows She denies stress, dehydration or illness No radiating neck pain  Hypertension: Patient here for follow-up of elevated blood pressure. She is not exercising and is adherent to low salt diet.  Blood pressure is well controlled at home but she rarely checks it. Cardiac symptoms none. Patient denies chest pain, chest pressure/discomfort, irregular heart beat, near-syncope and palpitations.  Cardiovascular risk factors: advanced age (older than 57 for men, 59 for women), hypertension, obesity (BMI >= 30 kg/m2), sedentary lifestyle and smoking/ tobacco exposure.  BP Readings from Last 3 Encounters:  07/29/17 138/70  07/06/17 (!) 166/111  06/29/17 122/80    Chronic Pain  Pt is on lyrica and needs a refill Her current dose is 300mg  twice a day This is helping her pain and she reports that so far the dose increase has made her pain more tolerated Pain score is 2/10 She reports that she had an injection in the back for her pain and she can stand more and do more activities She states that she still has aches but denies pains  Tobacco use She continues to smokes cigarettes She reports that she is smoking less than a pack a day due to not smoking in the house and not going outside in the cold She has not been able to quit She reports morning cough daily when she first gets up but denies shortness of breath.  Anxiety and Depression She is taking the venlafaxine for her mood She reports overall good mood and denies depression and anxiety She denies side effects of the venlafaxine Depression screen [facility] 2/9 07/29/2017 04/11/2017 03/27/2017  02/04/2017 02/04/2017  Decreased Interest 0 0 1 1 0  Down, Depressed, Hopeless 0 0 1 1 0  PHQ - 2 Score 0 0 2 2 0  Altered sleeping - - 0 0 -  Tired, decreased energy - - 3 3 -  Change in appetite - - 0 0 -  Feeling bad or failure about yourself  - - 0 1 -  Trouble concentrating - - 3 2 -  Moving slowly or fidgety/restless - - 0 1 -  Suicidal thoughts - - 0 0 -  PHQ-9 Score - - 8 9 -  Difficult doing work/chores - - - Somewhat difficult -      Past Medical History:  Diagnosis Date  . Allergy     Current Outpatient Medications  Medication Sig Dispense Refill  . aspirin EC 81 MG tablet Take 81 mg by mouth daily.    . celecoxib (CELEBREX) 200 MG capsule Take 200 mg by mouth 2 (two) times daily.    . cyclobenzaprine (FLEXERIL) 10 MG tablet Take 1 tablet (10 mg total) by mouth 3 (three) times daily as needed for muscle spasms. 30 tablet 0  . fluticasone (FLONASE) 50 MCG/ACT nasal spray Place 2 sprays into both nostrils daily. 16 g 6  . HYDROcodone-acetaminophen (NORCO/VICODIN) 5-325 MG tablet Take 1 tablet by mouth every 6 (six) hours as needed for moderate pain. 30 tablet 0  . levothyroxine (SYNTHROID, LEVOTHROID) 75 MCG tablet TAKE 1 TABLET BY MOUTH DAILY  BEFORE BREAKFAST WITH NO OTHER MEDS 90 tablet 1  . losartan-hydrochlorothiazide (HYZAAR) 100-25 MG tablet Take 1 tablet by mouth daily. 30 tablet 11  . omeprazole (PRILOSEC) 20 MG capsule Take 20 mg by mouth daily.    . pregabalin (LYRICA) 300 MG capsule Take 1 capsule (300 mg total) by mouth 2 (two) times daily. 180 capsule 1  . venlafaxine XR (EFFEXOR-XR) 150 MG 24 hr capsule TAKE 1 CAPSULE(150 MG) BY MOUTH TWICE DAILY WITH A MEAL 180 capsule 3   No current facility-administered medications for this visit.     Allergies:  Allergies  Allergen Reactions  . Sulfa Antibiotics     Past Surgical History:  Procedure Laterality Date  . NO PAST SURGERIES      Social History   Socioeconomic History  . Marital status: Divorced      Spouse name: None  . Number of children: None  . Years of education: None  . Highest education level: None  Social Needs  . Financial resource strain: None  . Food insecurity - worry: None  . Food insecurity - inability: None  . Transportation needs - medical: None  . Transportation needs - non-medical: None  Occupational History  . None  Tobacco Use  . Smoking status: Current Every Day Smoker    Packs/day: 0.75    Years: 37.00    Pack years: 27.75  . Smokeless tobacco: Never Used  Substance and Sexual Activity  . Alcohol use: No  . Drug use: No  . Sexual activity: None  Other Topics Concern  . None  Social History Narrative  . None    Family History  Problem Relation Age of Onset  . Stroke Neg Hx      ROS Review of Systems See HPI Constitution: No fevers or chills No malaise No diaphoresis Skin: No rash or itching Eyes: no blurry vision, no double vision GU: no dysuria or hematuria Neuro: no dizziness or headaches  all others reviewed and negative   Objective: Vitals:   07/29/17 1155  BP: 138/70  Pulse: (!) 103  Resp: 17  Temp: 98.3 F (36.8 C)  TempSrc: Oral  SpO2: 96%  Weight: 184 lb 6.4 oz (83.6 kg)  Height: 5' 2.5" (1.588 m)    Physical Exam  Constitutional: She is oriented to person, place, and time. She appears well-developed and well-nourished.  HENT:  Head: Normocephalic and atraumatic.  Eyes: Conjunctivae and EOM are normal.  Cardiovascular: Normal rate, regular rhythm and normal heart sounds.  No murmur heard. Pulmonary/Chest: Effort normal and breath sounds normal. No stridor. No respiratory distress.  Musculoskeletal:       Cervical back: She exhibits decreased range of motion and spasm. She exhibits no tenderness, no bony tenderness, no swelling, no edema, no deformity, no laceration and no pain.  Neurological: She is alert and oriented to person, place, and time.  Skin: Skin is warm. Capillary refill takes less than 2 seconds.   Psychiatric: She has a normal mood and affect. Her behavior is normal. Judgment and thought content normal.     Assessment and Plan Laurissa was seen today for follow-up.  Diagnoses and all orders for this visit:  Chronic pain syndrome- refilled pain medication today -     pregabalin (LYRICA) 300 MG capsule; Take 1 capsule (300 mg total) by mouth 2 (two) times daily.  Other intervertebral disc degeneration, lumbar region -     pregabalin (LYRICA) 300 MG capsule; Take 1 capsule (300 mg total) by mouth  2 (two) times daily.  Smoker- discussed smoking cessation and patient not  Yet ready to quit  Essential hypertension- bp in good range -     Basic metabolic panel  Encounter for medication monitoring -     Basic metabolic panel  Torticollis, acute- advised heat pad and muscle relaxer  Other orders -     Cancel: Ambulatory referral to Gastroenterology -     cyclobenzaprine (FLEXERIL) 10 MG tablet; Take 1 tablet (10 mg total) by mouth 3 (three) times daily as needed for muscle spasms. -     levothyroxine (SYNTHROID, LEVOTHROID) 75 MCG tablet; TAKE 1 TABLET BY MOUTH DAILY BEFORE BREAKFAST WITH NO OTHER MEDS  Six month follow up advised    [place]

## 2017-07-29 ENCOUNTER — Encounter: Payer: Self-pay | Admitting: Family Medicine

## 2017-07-29 ENCOUNTER — Ambulatory Visit (INDEPENDENT_AMBULATORY_CARE_PROVIDER_SITE_OTHER): Payer: Medicare HMO | Admitting: Family Medicine

## 2017-07-29 ENCOUNTER — Other Ambulatory Visit: Payer: Self-pay

## 2017-07-29 VITALS — BP 138/70 | HR 103 | Temp 98.3°F | Resp 17 | Ht 62.5 in | Wt 184.4 lb

## 2017-07-29 DIAGNOSIS — Z5181 Encounter for therapeutic drug level monitoring: Secondary | ICD-10-CM

## 2017-07-29 DIAGNOSIS — M5136 Other intervertebral disc degeneration, lumbar region: Secondary | ICD-10-CM

## 2017-07-29 DIAGNOSIS — F17200 Nicotine dependence, unspecified, uncomplicated: Secondary | ICD-10-CM

## 2017-07-29 DIAGNOSIS — I10 Essential (primary) hypertension: Secondary | ICD-10-CM

## 2017-07-29 DIAGNOSIS — G894 Chronic pain syndrome: Secondary | ICD-10-CM | POA: Diagnosis not present

## 2017-07-29 DIAGNOSIS — R69 Illness, unspecified: Secondary | ICD-10-CM | POA: Diagnosis not present

## 2017-07-29 DIAGNOSIS — M436 Torticollis: Secondary | ICD-10-CM | POA: Diagnosis not present

## 2017-07-29 MED ORDER — CYCLOBENZAPRINE HCL 10 MG PO TABS: 10.0000 mg | ORAL_TABLET | Freq: Three times a day (TID) | ORAL | 0 refills | Status: DC | PRN

## 2017-07-29 MED ORDER — LEVOTHYROXINE SODIUM 75 MCG PO TABS: ORAL_TABLET | ORAL | 1 refills | Status: DC

## 2017-07-29 MED ORDER — PREGABALIN 300 MG PO CAPS: 300.0000 mg | ORAL_CAPSULE | Freq: Two times a day (BID) | ORAL | 1 refills | Status: DC

## 2017-07-29 NOTE — Patient Instructions (Addendum)
IF you received an x-ray today, you will receive an invoice from [facility]. Please contact [facility] at [phone] with questions or concerns regarding your invoice.   IF you received labwork today, you will receive an invoice from [facility]. Please contact [facility] at [phone] with questions or concerns regarding your invoice.   Our billing staff will not be able to assist you with questions regarding bills from these companies.  You will be contacted with the lab results as soon as they are available. The fastest way to get your results is to activate your My Chart account. Instructions are located on the last page of this paperwork. If you have not heard from [place] regarding the results in 2 weeks, please contact this office.         Acute Torticollis, Adult Torticollis is a condition in which the muscles of the neck tighten (contract) abnormally, causing the neck to twist and the head to move into an unnatural position. Torticollis that develops suddenly is called acute torticollis. People with acute torticollis may have trouble turning their head. The condition can be painful and may range from mild to severe. What are the causes? This condition may be caused by:  Sleeping in an awkward position (common).  Extending or twisting the neck muscles beyond their normal position.  An injury to the neck muscles.  An infection.  A tumor.  Certain medicines.  Long-lasting spasms of the neck muscles.  In some cases, the cause may not be known. What increases the risk? You are more likely to develop this condition if:  You have a condition associated with loose ligaments, such as Down syndrome.  You have a brain condition that affects vision, such as strabismus.  What are the signs or symptoms? The main symptom of this condition is tilting of the head to one side. Other symptoms include:  Pain in the neck.  Trouble turning the head from side to  side or up and down.  How is this diagnosed? This condition may be diagnosed based on:  A physical exam.  Your medical history.  Imaging tests, such as: ? An X-ray. ? An ultrasound. ? A CT scan. ? An MRI.  How is this treated? Treatment for this condition depends on what is causing the condition. Mild cases may go away without treatment. Treatment for more serious cases may include:  Medicines or shots to relax the muscles.  Other medicines, such as antibiotics to treat the underlying cause.  Wearing a soft neck collar.  Physical therapy and stretching to improve neck strength and flexibility.  Neck massage.  In severe cases, surgery may be needed to repair dislocated or broken bones or to treat nerves in the neck. Follow these instructions at home:  Take over-the-counter and prescription medicines only as told by your health care provider.  Do stretching exercises and massage your neck as told by your health care provider.  If directed, apply heat to the affected area as often as told by your health care provider. Use the heat source that your health care provider recommends, such as a moist heat pack or a heating pad. ? Place a towel between your skin and the heat source. ? Leave the heat on for 20-30 minutes. ? Remove the heat if your skin turns bright red. This is especially important if you are unable to feel pain, heat, or cold. You may have a greater risk of getting burned.  If you wake up with  torticollis after sleeping, check your bed or sleeping area. Look for lumpy pillows or unusual objects. Make sure your bed and sleeping area are comfortable.  Keep all follow-up visits as told by your health care provider. This is important. Contact a health care provider if:  You have a fever.  Your symptoms do not improve or they get worse. Get help right away if:  You have trouble breathing.  You develop noisy breathing (stridor).  You start to drool.  You have  trouble swallowing or pain when swallowing.  You develop numbness or weakness in your hands or feet.  You have changes in your speech, understanding, or vision.  You are in severe pain.  You cannot move your head or neck. Summary  Torticollis is a condition in which the muscles of the neck tighten (contract) abnormally, causing the neck to twist and the head to move into an unnatural position. Torticollis that develops suddenly is called acute torticollis.  Treatment for this condition depends on what is causing the condition. Mild cases may go away without treatment.  Do stretching exercises and massage your neck as told by your health care provider. You may also be instructed to apply heat to the area.  Contact your health care provider if your symptoms do not improve or they get worse. This information is not intended to replace advice given to you by your health care provider. Make sure you discuss any questions you have with your health care provider. Document Released: 08/01/2000 Document Revised: 10/02/2016 Document Reviewed: 10/02/2016 Elsevier Interactive Patient Education  [employer].

## 2017-07-30 LAB — BASIC METABOLIC PANEL
BUN/Creatinine Ratio: 21 (ref 12–28)
BUN: 15 mg/dL (ref 8–27)
CALCIUM: 9.4 mg/dL (ref 8.7–10.3)
CO2: 27 mmol/L (ref 20–29)
CREATININE: 0.73 mg/dL (ref 0.57–1.00)
Chloride: 97 mmol/L (ref 96–106)
GFR calc Af Amer: 96 mL/min/{1.73_m2} (ref >59)
GFR calc non Af Amer: 83 mL/min/{1.73_m2} (ref >59)
GLUCOSE: 90 mg/dL (ref 65–99)
Potassium: 4.1 mmol/L (ref 3.5–5.2)
SODIUM: 141 mmol/L (ref 134–144)

## 2017-08-10 ENCOUNTER — Telehealth: Payer: Self-pay

## 2017-08-10 NOTE — Telephone Encounter (Signed)
Checking status of PA filed on 08/03/2017.  There is still no result.  Will check again on Wednesday.

## 2017-08-11 ENCOUNTER — Other Ambulatory Visit: Payer: Self-pay | Admitting: Family Medicine

## 2017-08-12 NOTE — Telephone Encounter (Signed)
PA was approved for Flexeril.  Patient and pharmacy notified.

## 2017-08-26 ENCOUNTER — Encounter: Payer: Medicare HMO | Admitting: Neurology

## 2017-10-01 ENCOUNTER — Ambulatory Visit: Payer: Medicare HMO | Admitting: Neurology

## 2017-10-01 ENCOUNTER — Ambulatory Visit (INDEPENDENT_AMBULATORY_CARE_PROVIDER_SITE_OTHER): Payer: Medicare HMO | Admitting: Neurology

## 2017-10-01 DIAGNOSIS — Z0289 Encounter for other administrative examinations: Secondary | ICD-10-CM

## 2017-10-01 DIAGNOSIS — M79605 Pain in left leg: Secondary | ICD-10-CM

## 2017-10-01 DIAGNOSIS — M79672 Pain in left foot: Secondary | ICD-10-CM

## 2017-10-01 NOTE — Progress Notes (Signed)
Full Name: Bonnie Lawson Gender: Female MRN #: [id number] Date of Birth: 1946/06/08    Visit Date: 10/01/2017 09:14 Age: 72 Years 40 Months Old Examining Physician: Sarina Ill, MD  Referring Physician: Rodell Perna, MD, Benjiman Core MD  History: This is a 72 year old female.  She has a history of remote left ankle fracture.  Per referring physician notes from [place], she has not had relief with previous injection and with her history of left ankle fracture and nerve conduction EMG study was ordered to rule out peripheral neuropathy versus lumbar radiculopathy.  She has been having L3-L4 and L4-L5 injections.  Her back pain is better but she still continues to have the burning pain in her left ankle foot and also mid left tibia.  Has been ongoing for several years and started after she had a left ankle fracture.  She was started on Lyrica in the past also been on gabapentin.  Lyrica does help to control the burning at times.  Summary: EMG/NCS was performed on the bilateral lower extremities.  All muscles and nerves (as detailed in the following tables) were within normal limits.    Conclusion: This is a normal study.  No electrophysiologic evidence for mononeuropathy, polyneuropathy or radiculopathy.  Cc: Dr. Lorin Mercy and Coralie Keens, M.D.  [facility] [place], [place] [address] Tel: [phone] Fax: [phone]        [facility]    Nerve / Sites Muscle Latency Ref. Amplitude Ref. Rel Amp Segments Distance Velocity Ref. Area    ms ms mV mV %  cm m/s m/s mVms  L Peroneal - EDB     Ankle EDB 5.1 ?6.5 4.0 ?2.0 100 Ankle - EDB 9   13.8     Fib head EDB 10.7  4.0  97.8 Fib head - Ankle 28 50 ?44 13.3     Pop fossa EDB 12.3  4.1  104 Pop fossa - Fib head 8 51 ?44 14.0         Pop fossa - Ankle      R Peroneal - EDB     Ankle EDB 5.2 ?6.5 5.1 ?2.0 100 Ankle - EDB 9   19.2     Fib head EDB 11.1  5.0  96.9 Fib head - Ankle 28 47 ?44 19.0     Pop fossa EDB 12.8  4.9  98.4 Pop fossa - Fib head 8 50 ?44 17.8         Pop fossa - Ankle      L Tibial - AH     Ankle AH 4.4 ?5.8 5.6 ?4.0 100 Ankle - AH 9   15.8     Pop fossa AH 12.1  4.1  73 Pop fossa - Ankle 34 44 ?41 14.5  R Tibial - AH     Ankle AH 5.3 ?5.8 4.3 ?4.0 100 Ankle - AH 9   17.7     Pop fossa AH 13.0  3.6  85.3 Pop fossa - Ankle 34 44 ?41 16.8             SNC    Nerve / Sites Rec. Site Peak Lat Ref.  Amp Ref. Segments Distance    ms ms V V  cm  L Sural - Ankle (Calf)     Calf Ankle 4.1 ?4.4 12 ?6 Calf - Ankle 14  R Sural - Ankle (Calf)     Calf Ankle 4.3 ?4.4 12 ?6  Calf - Ankle 14  R Superficial peroneal - Ankle     Lat leg Ankle 4.1 ?4.4 14 ?6 Lat leg - Ankle 14  L Superficial peroneal - Ankle     Lat leg Ankle 3.9 ?4.4 8 ?6 Lat leg - Ankle 14              F  Wave    Nerve F Lat Ref.   ms ms  L Tibial - AH 46.1 ?56.0  R Tibial - AH 46.6 ?56.0         EMG full       EMG Summary Table    Spontaneous MUAP Recruitment  Muscle IA Fib PSW Fasc Other Amp Dur. Poly Pattern  L. Vastus medialis Normal None None None _______ Normal Normal Normal Normal  L. Tibialis anterior Normal None None None _______ Normal Normal Normal Normal  L. Gastrocnemius (Medial head) Normal None None None _______ Normal Normal Normal Normal  L. Extensor hallucis longus Normal None None None _______ Normal Normal Normal Normal  L. Abductor hallucis Normal None None None _______ Normal Normal Normal Normal  L. Abductor digiti minimi (pedis) Normal None None None _______ Normal Normal Normal Normal  L. Biceps femoris (long head) Normal None None None _______ Normal Normal Normal Normal  L. Gluteus maximus Normal None None None _______ Normal Normal Normal Normal  L. Gluteus medius Normal None None None _______ Normal Normal Normal Normal  L. Lumbar paraspinals (low) Normal None None None _______ Normal Normal Normal Normal

## 2017-10-01 NOTE — Procedures (Signed)
Full Name: Kataleia Quaranta Gender: Female MRN #: [id number] Date of Birth: Jul 11, 1946    Visit Date: 10/01/2017 09:14 Age: 72 Years 77 Months Old Examining Physician: Sarina Ill, MD  Referring Physician: Rodell Perna, MD, Benjiman Core MD  History: This is a 72 year old female.  She has a history of remote left ankle fracture.  Per referring physician notes from [place], she has not had relief with previous injection and with her history of left ankle fracture and nerve conduction EMG study was ordered to rule out peripheral neuropathy versus lumbar radiculopathy.  She has been having L3-L4 and L4-L5 injections.  Her back pain is better but she still continues to have the burning pain in her left ankle foot and also mid left tibia.  Has been ongoing for several years and started after she had a left ankle fracture.  She was started on Lyrica in the past also been on gabapentin.  Lyrica does help to control the burning at times.  Summary: EMG/NCS was performed on the bilateral lower extremities.  All muscles and nerves (as detailed in the following tables) were within normal limits.    Conclusion: This is a normal study.  No electrophysiologic evidence for mononeuropathy, polyneuropathy or radiculopathy.  Cc: Dr. Lorin Mercy and Coralie Keens, M.D.  [facility] [place], [place] [address] Tel: [phone] Fax: [phone]        [facility]    Nerve / Sites Muscle Latency Ref. Amplitude Ref. Rel Amp Segments Distance Velocity Ref. Area    ms ms mV mV %  cm m/s m/s mVms  L Peroneal - EDB     Ankle EDB 5.1 ?6.5 4.0 ?2.0 100 Ankle - EDB 9   13.8     Fib head EDB 10.7  4.0  97.8 Fib head - Ankle 28 50 ?44 13.3     Pop fossa EDB 12.3  4.1  104 Pop fossa - Fib head 8 51 ?44 14.0         Pop fossa - Ankle      R Peroneal - EDB     Ankle EDB 5.2 ?6.5 5.1 ?2.0 100 Ankle - EDB 9   19.2     Fib head EDB 11.1  5.0  96.9 Fib head - Ankle 28 47 ?44 19.0     Pop fossa EDB 12.8  4.9  98.4 Pop fossa - Fib head 8 50 ?44 17.8         Pop fossa - Ankle      L Tibial - AH     Ankle AH 4.4 ?5.8 5.6 ?4.0 100 Ankle - AH 9   15.8     Pop fossa AH 12.1  4.1  73 Pop fossa - Ankle 34 44 ?41 14.5  R Tibial - AH     Ankle AH 5.3 ?5.8 4.3 ?4.0 100 Ankle - AH 9   17.7     Pop fossa AH 13.0  3.6  85.3 Pop fossa - Ankle 34 44 ?41 16.8             SNC    Nerve / Sites Rec. Site Peak Lat Ref.  Amp Ref. Segments Distance    ms ms V V  cm  L Sural - Ankle (Calf)     Calf Ankle 4.1 ?4.4 12 ?6 Calf - Ankle 14  R Sural - Ankle (Calf)     Calf Ankle 4.3 ?4.4 12 ?6  Calf - Ankle 14  R Superficial peroneal - Ankle     Lat leg Ankle 4.1 ?4.4 14 ?6 Lat leg - Ankle 14  L Superficial peroneal - Ankle     Lat leg Ankle 3.9 ?4.4 8 ?6 Lat leg - Ankle 14              F  Wave    Nerve F Lat Ref.   ms ms  L Tibial - AH 46.1 ?56.0  R Tibial - AH 46.6 ?56.0         EMG full       EMG Summary Table    Spontaneous MUAP Recruitment  Muscle IA Fib PSW Fasc Other Amp Dur. Poly Pattern  L. Vastus medialis Normal None None None _______ Normal Normal Normal Normal  L. Tibialis anterior Normal None None None _______ Normal Normal Normal Normal  L. Gastrocnemius (Medial head) Normal None None None _______ Normal Normal Normal Normal  L. Extensor hallucis longus Normal None None None _______ Normal Normal Normal Normal  L. Abductor hallucis Normal None None None _______ Normal Normal Normal Normal  L. Abductor digiti minimi (pedis) Normal None None None _______ Normal Normal Normal Normal  L. Biceps femoris (long head) Normal None None None _______ Normal Normal Normal Normal  L. Gluteus maximus Normal None None None _______ Normal Normal Normal Normal  L. Gluteus medius Normal None None None _______ Normal Normal Normal Normal  L. Lumbar paraspinals (low) Normal None None None _______ Normal Normal Normal Normal

## 2017-10-01 NOTE — Progress Notes (Signed)
See procedure note.

## 2018-01-01 DIAGNOSIS — H5203 Hypermetropia, bilateral: Secondary | ICD-10-CM | POA: Diagnosis not present

## 2018-01-05 DIAGNOSIS — H5203 Hypermetropia, bilateral: Secondary | ICD-10-CM | POA: Diagnosis not present

## 2018-01-27 ENCOUNTER — Other Ambulatory Visit: Payer: Self-pay

## 2018-01-27 ENCOUNTER — Ambulatory Visit (INDEPENDENT_AMBULATORY_CARE_PROVIDER_SITE_OTHER): Payer: Medicare HMO | Admitting: Family Medicine

## 2018-01-27 ENCOUNTER — Encounter: Payer: Self-pay | Admitting: Family Medicine

## 2018-01-27 DIAGNOSIS — G894 Chronic pain syndrome: Secondary | ICD-10-CM

## 2018-01-27 DIAGNOSIS — M5136 Other intervertebral disc degeneration, lumbar region: Secondary | ICD-10-CM | POA: Diagnosis not present

## 2018-01-27 MED ORDER — PREGABALIN 300 MG PO CAPS: 300.0000 mg | ORAL_CAPSULE | Freq: Two times a day (BID) | ORAL | 1 refills | Status: DC

## 2018-01-27 MED ORDER — HYDROCODONE-ACETAMINOPHEN 5-325 MG PO TABS: 1.0000 | ORAL_TABLET | Freq: Four times a day (QID) | ORAL | 0 refills | Status: DC | PRN

## 2018-01-27 NOTE — Progress Notes (Signed)
Chief Complaint  Patient presents with  . Chronic pain syndrome    6 month f/u, back pain, still having issues with left arm pain  . Medication Refill    vicodin and lyrica    HPI  Patient is here for pain med refills for her chronic pain Her pain today is 4/10 She reports that the Norco and gabapentin help her She has a bowel regimen and does not have chronic constipation She does not use alcohol or any sleep meds that are sedatives She does stretches to help her joint pains  Past Medical History:  Diagnosis Date  . Allergy     Current Outpatient Medications  Medication Sig Dispense Refill  . aspirin EC 81 MG tablet Take 81 mg by mouth daily.    . celecoxib (CELEBREX) 200 MG capsule Take 200 mg by mouth 2 (two) times daily.    . cyclobenzaprine (FLEXERIL) 10 MG tablet Take 1 tablet (10 mg total) by mouth 3 (three) times daily as needed for muscle spasms. 30 tablet 0  . fluticasone (FLONASE) 50 MCG/ACT nasal spray Place 2 sprays into both nostrils daily. 16 g 6  . HYDROcodone-acetaminophen (NORCO/VICODIN) 5-325 MG tablet Take 1 tablet by mouth every 6 (six) hours as needed for moderate pain. 30 tablet 0  . levothyroxine (SYNTHROID, LEVOTHROID) 75 MCG tablet TAKE 1 TABLET BY MOUTH DAILY BEFORE BREAKFAST WITH NO OTHER MEDS 90 tablet 1  . lisinopril-hydrochlorothiazide (PRINZIDE,ZESTORETIC) 10-12.5 MG tablet TAKE 2 TABLETS BY MOUTH DAILY 180 tablet 0  . losartan-hydrochlorothiazide (HYZAAR) 100-25 MG tablet Take 1 tablet by mouth daily. 30 tablet 11  . omeprazole (PRILOSEC) 20 MG capsule Take 20 mg by mouth daily.    . pregabalin (LYRICA) 300 MG capsule Take 1 capsule (300 mg total) by mouth 2 (two) times daily. 180 capsule 1  . venlafaxine XR (EFFEXOR-XR) 150 MG 24 hr capsule TAKE 1 CAPSULE(150 MG) BY MOUTH TWICE DAILY WITH A MEAL 180 capsule 3   No current facility-administered medications for this visit.     Allergies:  Allergies  Allergen Reactions  . Sulfa Antibiotics       Past Surgical History:  Procedure Laterality Date  . NO PAST SURGERIES      Social History   Socioeconomic History  . Marital status: Divorced    Spouse name: Not on file  . Number of children: Not on file  . Years of education: Not on file  . Highest education level: Not on file  Occupational History  . Not on file  Social Needs  . Financial resource strain: Not on file  . Food insecurity:    Worry: Not on file    Inability: Not on file  . Transportation needs:    Medical: Not on file    Non-medical: Not on file  Tobacco Use  . Smoking status: Current Every Day Smoker    Packs/day: 0.75    Years: 37.00    Pack years: 27.75  . Smokeless tobacco: Never Used  Substance and Sexual Activity  . Alcohol use: No  . Drug use: No  . Sexual activity: Not on file  Lifestyle  . Physical activity:    Days per week: Not on file    Minutes per session: Not on file  . Stress: Not on file  Relationships  . Social connections:    Talks on phone: Not on file    Gets together: Not on file    Attends religious service: Not on file  Active member of club or organization: Not on file    Attends meetings of clubs or organizations: Not on file    Relationship status: Not on file  Other Topics Concern  . Not on file  Social History Narrative  . Not on file    Family History  Problem Relation Age of Onset  . Stroke Neg Hx      ROS Review of Systems See HPI Constitution: No fevers or chills No malaise No diaphoresis Skin: No rash or itching Eyes: no blurry vision, no double vision GU: no dysuria or hematuria Neuro: no dizziness or headaches  all others reviewed and negative   Objective: Vitals:   01/27/18 1129  BP: (!) 157/85  Pulse: 96  Resp: 18  Temp: 99.3 F (37.4 C)  TempSrc: Oral  SpO2: 96%  Weight: 186 lb 3.2 oz (84.5 kg)  Height: 5' 2.5" (1.588 m)    Physical Exam  Constitutional: She is oriented to person, place, and time. She appears  well-developed and well-nourished.  HENT:  Head: Normocephalic and atraumatic.  Eyes: Conjunctivae and EOM are normal.  Cardiovascular: Normal rate, regular rhythm and normal heart sounds.  No murmur heard. Pulmonary/Chest: Effort normal and breath sounds normal. No stridor. No respiratory distress. She has no wheezes.  Neurological: She is alert and oriented to person, place, and time.      Assessment and Plan Sel[place] was seen today for chronic pain syndrome and medication refill.  Diagnoses and all orders for this visit:  Chronic pain syndrome -     pregabalin (LYRICA) 300 MG capsule; Take 1 capsule (300 mg total) by mouth 2 (two) times daily. -     HYDROcodone-acetaminophen (NORCO/VICODIN) 5-325 MG tablet; Take 1 tablet by mouth every 6 (six) hours as needed for moderate pain.  Other intervertebral disc degeneration, lumbar region -     pregabalin (LYRICA) 300 MG capsule; Take 1 capsule (300 mg total) by mouth 2 (two) times daily.  reviewed PMP aware and refills were appropriate Reviewed pain contract and is up to date Pt to follow up in 6 months for lyrica refills   [place]

## 2018-01-27 NOTE — Patient Instructions (Signed)
     IF you received an x-ray today, you will receive an invoice from [facility]. Please contact [facility] at [phone] with questions or concerns regarding your invoice.   IF you received labwork today, you will receive an invoice from [facility]. Please contact [facility] at [phone] with questions or concerns regarding your invoice.   Our billing staff will not be able to assist you with questions regarding bills from these companies.  You will be contacted with the lab results as soon as they are available. The fastest way to get your results is to activate your My Chart account. Instructions are located on the last page of this paperwork. If you have not heard from us regarding the results in 2 weeks, please contact this office.     

## 2018-05-08 ENCOUNTER — Other Ambulatory Visit: Payer: Self-pay | Admitting: Family Medicine

## 2018-05-08 DIAGNOSIS — F321 Major depressive disorder, single episode, moderate: Secondary | ICD-10-CM

## 2018-05-08 DIAGNOSIS — F32A Depression, unspecified: Secondary | ICD-10-CM

## 2018-05-10 NOTE — Telephone Encounter (Signed)
Venlafaxine ER 150 mg refill Last Refill:05/11/17 # 180  And 3 refills Last OV: 01/27/18 PCP: [place]: [facility] # [phone]

## 2018-05-11 ENCOUNTER — Other Ambulatory Visit: Payer: Self-pay | Admitting: Internal Medicine

## 2018-05-13 IMAGING — CT CT PARANASAL SINUSES LIMITED
1 of 2 series · 8 of 11 positions shown, 10 images · non-contrast
Comparison: MRI brain 03/27/2017

CLINICAL DATA: The upper airway cough syndrome. Intermittent nasal
congestion.

EXAM:
CT PARANASAL SINUS LIMITED WITHOUT CONTRAST
TECHNIQUE: Non-contiguous multidetector CT images of the paranasal sinuses were
obtained in a single plane without contrast.

[Series 4: limited sinus st · axial · 0.25mm/px · z∈[+160,+230]mm · 8 of 10 slices shown, 10 images]
[im 2/10  brain]
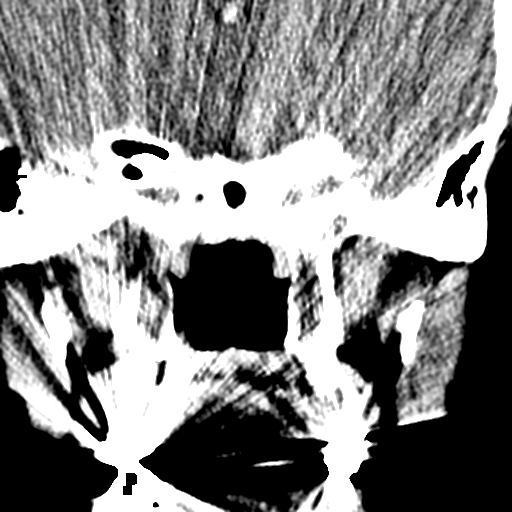
[im 2/10  bone]
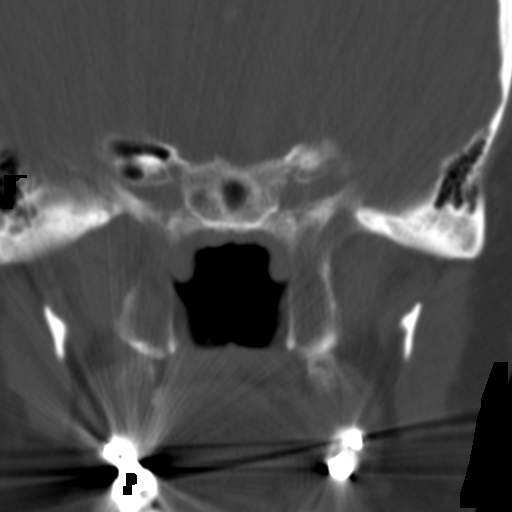
[im 3/10  bone]
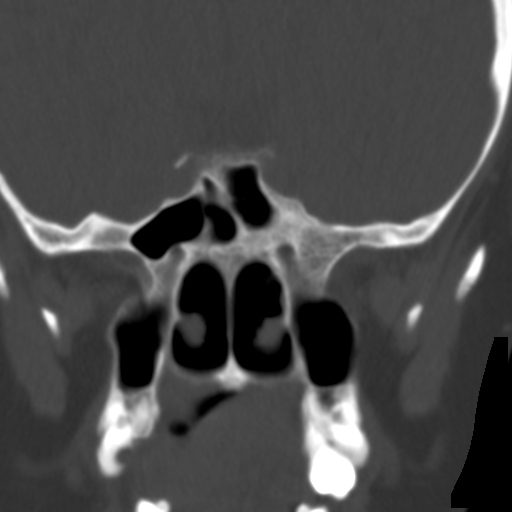
[im 4/10  bone]
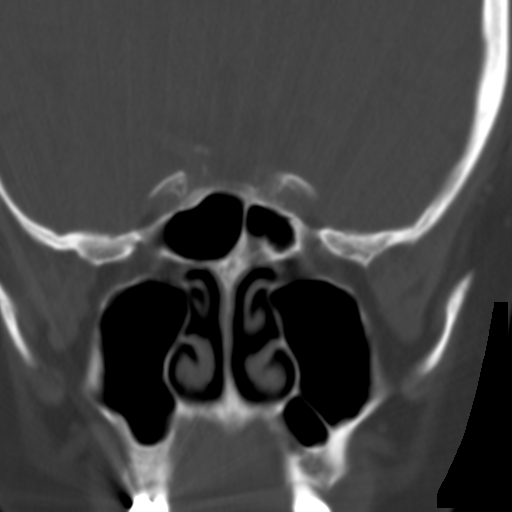
[im 5/10  bone]
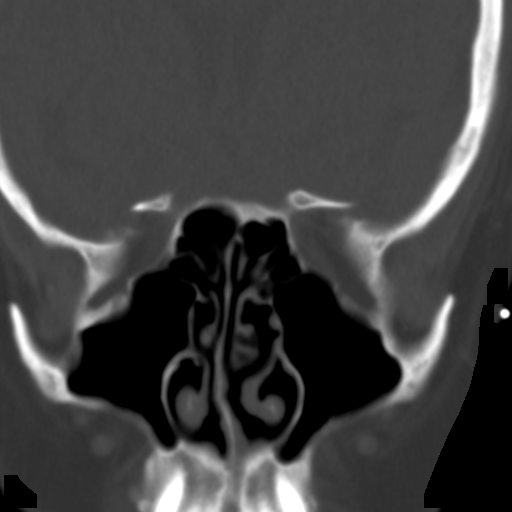
[im 6/10  brain]
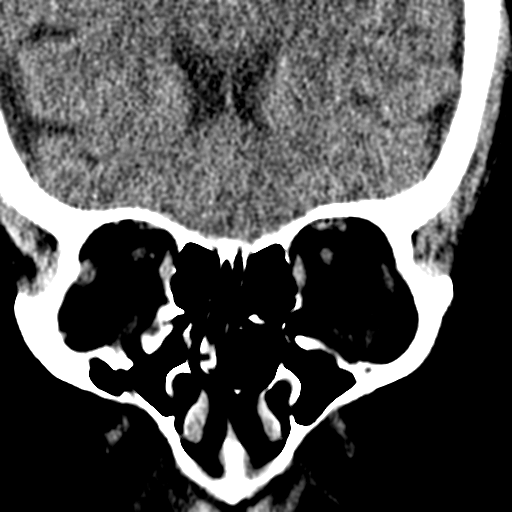
[im 6/10  bone]
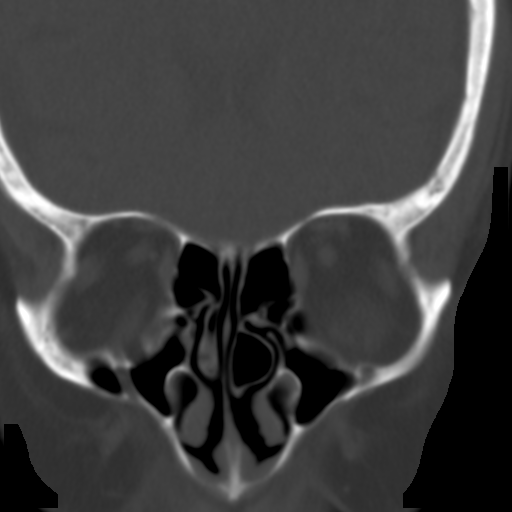
[im 7/10  bone]
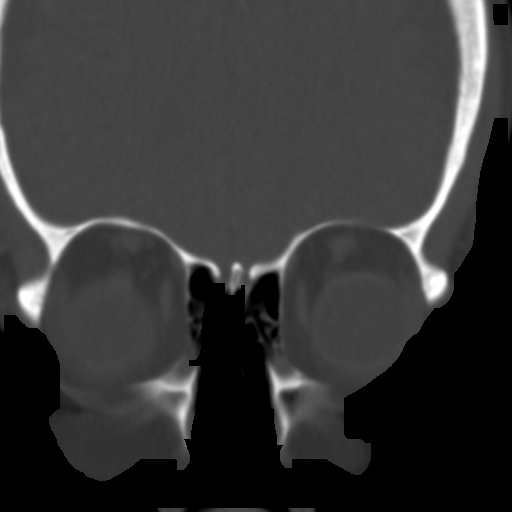
[im 8/10  bone]
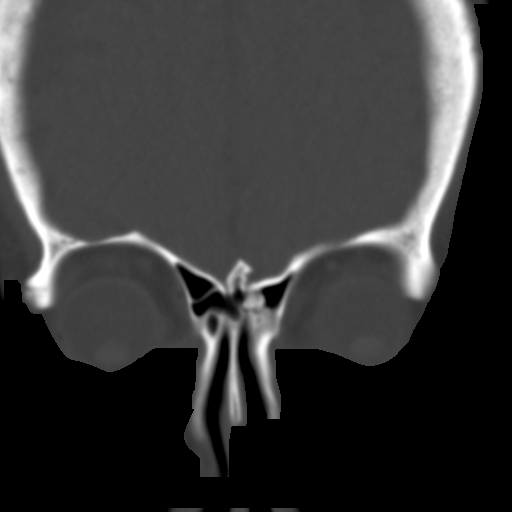
[im 9/10  bone]
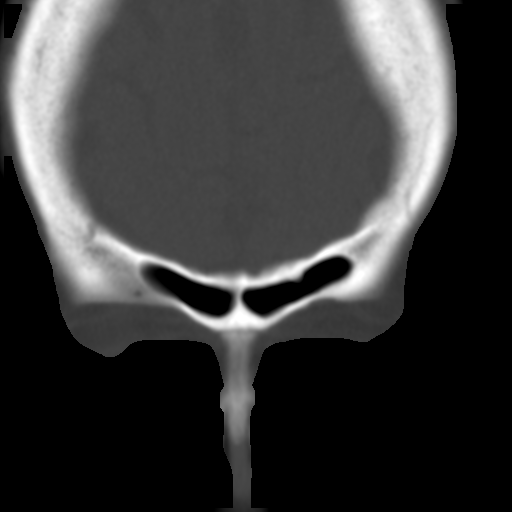

[8 of 11 positions shown; findings below may reference images not displayed]

FINDINGS: Screening exam stents trace no significant sinus disease. No focal
mucosal disease or fluid levels are present.

Limited imaging the brain is within normal limits.
IMPRESSION: Negative paranasal sinus CT.

## 2018-05-19 ENCOUNTER — Telehealth: Payer: Self-pay | Admitting: Family Medicine

## 2018-05-19 NOTE — Telephone Encounter (Signed)
Called and LVM for pt regarding their scheduled appt on 12/16 with Dr. Nolon Rod. Due to the providers schedule changing, we will need to get the pt rescheduled. When pt calls back, please reschedule her for an appt at her convenience with Dr. Nolon Rod for an OV : folow up 6 month med refill  Thank you!

## 2018-05-27 DIAGNOSIS — I739 Peripheral vascular disease, unspecified: Secondary | ICD-10-CM | POA: Diagnosis not present

## 2018-05-27 DIAGNOSIS — G8929 Other chronic pain: Secondary | ICD-10-CM | POA: Diagnosis not present

## 2018-05-27 DIAGNOSIS — H04129 Dry eye syndrome of unspecified lacrimal gland: Secondary | ICD-10-CM | POA: Diagnosis not present

## 2018-05-27 DIAGNOSIS — I10 Essential (primary) hypertension: Secondary | ICD-10-CM | POA: Diagnosis not present

## 2018-05-27 DIAGNOSIS — R69 Illness, unspecified: Secondary | ICD-10-CM | POA: Diagnosis not present

## 2018-05-27 DIAGNOSIS — G629 Polyneuropathy, unspecified: Secondary | ICD-10-CM | POA: Diagnosis not present

## 2018-05-27 DIAGNOSIS — E039 Hypothyroidism, unspecified: Secondary | ICD-10-CM | POA: Diagnosis not present

## 2018-06-05 DIAGNOSIS — H16001 Unspecified corneal ulcer, right eye: Secondary | ICD-10-CM | POA: Diagnosis not present

## 2018-06-10 DIAGNOSIS — R69 Illness, unspecified: Secondary | ICD-10-CM | POA: Diagnosis not present

## 2018-06-14 ENCOUNTER — Other Ambulatory Visit: Payer: Self-pay | Admitting: Family Medicine

## 2018-06-15 NOTE — Telephone Encounter (Signed)
Requested medication (s) are due for refill today: omeprazole yes  Requested medication (s) are on the active medication list: yes  Last refill:  02/07/17  Future visit scheduled: yes  Notes to clinic:  Historical provider   Requested medication (s) are due for refill today: losartan/HTCZ  Requested medication (s) are on the active medication list: yes  Last refill:  02/20/18  Future visit scheduled: yes  Notes to clinic:  originally written 04/10/18 by Dr Melvyn Novas; per his pharmacy note dated 05/11/18 request made that this medication be addressed per PCP Requested Prescriptions  Pending Prescriptions Disp Refills   omeprazole (PRILOSEC) 40 MG capsule [Pharmacy Med Name: OMEPRAZOLE 40MG  CAPSULES] 90 capsule 0    Sig: TAKE ONE CAPSULE BY MOUTH DAILY     Gastroenterology: Proton Pump Inhibitors Passed - 06/14/2018 12:44 PM      Passed - Valid encounter within last 12 months    Recent Outpatient Visits          4 months ago Chronic pain syndrome   Primary Care at [facility], Zoe A, MD   10 months ago Chronic pain syndrome   Primary Care at [facility], Arlie Solomons, MD   1 year ago Smoker   Primary Care at [facility], Arlie Solomons, MD   1 year ago Smoker   Primary Care at [facility] [place], MD   1 year ago Encounter for Medicare annual wellness exam   Primary Care at [place], MD      Future Appointments            In 1 month Forrest Moron, MD Primary Care at [place], [facility]          losartan-hydrochlorothiazide (HYZAAR) 100-25 MG tablet [Pharmacy Med Name: LOSARTAN/HCTZ 100/25MG  TABLETS] 30 tablet 0    Sig: TAKE 1 TABLET BY MOUTH DAILY     Cardiovascular: ARB + Diuretic Combos Failed - 06/14/2018 12:44 PM      Failed - K in normal range and within 180 days    Potassium  Date Value Ref Range Status  07/29/2017 4.1 3.5 - 5.2 mmol/L Final         Failed - Na in normal range and within 180 days    Sodium  Date Value Ref Range Status  07/29/2017  141 134 - 144 mmol/L Final         Failed - Cr in normal range and within 180 days    Creat  Date Value Ref Range Status  04/26/2016 0.90 0.60 - 0.93 mg/dL Final    Comment:      For patients > or = 72 years of age: The upper reference limit for Creatinine is approximately 13% higher for people identified as African-American.      Creatinine, Ser  Date Value Ref Range Status  07/29/2017 0.73 0.57 - 1.00 mg/dL Final         Failed - Ca in normal range and within 180 days    Calcium  Date Value Ref Range Status  07/29/2017 9.4 8.7 - 10.3 mg/dL Final         Passed - Patient is not pregnant      Passed - Last BP in normal range    BP Readings from Last 1 Encounters:  01/27/18 138/88         Passed - Valid encounter within last 6 months    Recent Outpatient Visits          4  months ago Chronic pain syndrome   Primary Care at [place], MD   10 months ago Chronic pain syndrome   Primary Care at [facility], Arlie Solomons, MD   1 year ago Smoker   Primary Care at [facility], Arlie Solomons, MD   1 year ago Smoker   Primary Care at [facility], Arlie Solomons, MD   1 year ago Encounter for Medicare annual wellness exam   Primary Care at [place], MD      Future Appointments            In 1 month Forrest Moron, MD Primary Care at [place], [facility]

## 2018-07-13 ENCOUNTER — Telehealth: Payer: Self-pay | Admitting: Family Medicine

## 2018-07-13 NOTE — Telephone Encounter (Signed)
Called and LVM yesterday for pt to call the office and reschedule his appt. We have opened Dr. Nolon Rod 08/04/18 and I wanted to try and move him over to that day. If pt calls back, please see if he can come on that day. Thank you!

## 2018-07-30 ENCOUNTER — Ambulatory Visit: Payer: Medicare HMO | Admitting: Family Medicine

## 2018-08-02 ENCOUNTER — Other Ambulatory Visit: Payer: Self-pay

## 2018-08-02 ENCOUNTER — Ambulatory Visit: Payer: Medicare HMO | Admitting: Family Medicine

## 2018-08-02 ENCOUNTER — Ambulatory Visit (INDEPENDENT_AMBULATORY_CARE_PROVIDER_SITE_OTHER): Payer: Medicare HMO | Admitting: Family Medicine

## 2018-08-02 ENCOUNTER — Encounter: Payer: Self-pay | Admitting: Family Medicine

## 2018-08-02 VITALS — BP 166/84 | HR 84 | Temp 98.0°F | Resp 17 | Ht 62.5 in | Wt 189.0 lb

## 2018-08-02 DIAGNOSIS — I10 Essential (primary) hypertension: Secondary | ICD-10-CM | POA: Diagnosis not present

## 2018-08-02 DIAGNOSIS — Z1211 Encounter for screening for malignant neoplasm of colon: Secondary | ICD-10-CM

## 2018-08-02 DIAGNOSIS — M5136 Other intervertebral disc degeneration, lumbar region: Secondary | ICD-10-CM | POA: Diagnosis not present

## 2018-08-02 DIAGNOSIS — G894 Chronic pain syndrome: Secondary | ICD-10-CM | POA: Diagnosis not present

## 2018-08-02 DIAGNOSIS — F17200 Nicotine dependence, unspecified, uncomplicated: Secondary | ICD-10-CM

## 2018-08-02 DIAGNOSIS — Z5181 Encounter for therapeutic drug level monitoring: Secondary | ICD-10-CM

## 2018-08-02 DIAGNOSIS — E2839 Other primary ovarian failure: Secondary | ICD-10-CM | POA: Diagnosis not present

## 2018-08-02 DIAGNOSIS — Z1283 Encounter for screening for malignant neoplasm of skin: Secondary | ICD-10-CM

## 2018-08-02 DIAGNOSIS — F321 Major depressive disorder, single episode, moderate: Secondary | ICD-10-CM

## 2018-08-02 DIAGNOSIS — R69 Illness, unspecified: Secondary | ICD-10-CM | POA: Diagnosis not present

## 2018-08-02 DIAGNOSIS — E039 Hypothyroidism, unspecified: Secondary | ICD-10-CM | POA: Diagnosis not present

## 2018-08-02 DIAGNOSIS — F32A Depression, unspecified: Secondary | ICD-10-CM

## 2018-08-02 DIAGNOSIS — Z1239 Encounter for other screening for malignant neoplasm of breast: Secondary | ICD-10-CM

## 2018-08-02 DIAGNOSIS — M51369 Other intervertebral disc degeneration, lumbar region without mention of lumbar back pain or lower extremity pain: Secondary | ICD-10-CM

## 2018-08-02 DIAGNOSIS — Z8262 Family history of osteoporosis: Secondary | ICD-10-CM | POA: Diagnosis not present

## 2018-08-02 DIAGNOSIS — Z122 Encounter for screening for malignant neoplasm of respiratory organs: Secondary | ICD-10-CM

## 2018-08-02 DIAGNOSIS — D229 Melanocytic nevi, unspecified: Secondary | ICD-10-CM

## 2018-08-02 MED ORDER — FLUTICASONE PROPIONATE 50 MCG/ACT NA SUSP: 2.0000 | Freq: Every day | NASAL | 6 refills | Status: AC

## 2018-08-02 MED ORDER — HYDROCODONE-ACETAMINOPHEN 5-325 MG PO TABS: 1.0000 | ORAL_TABLET | Freq: Four times a day (QID) | ORAL | 0 refills | Status: DC | PRN

## 2018-08-02 MED ORDER — PREGABALIN 300 MG PO CAPS: 300.0000 mg | ORAL_CAPSULE | Freq: Two times a day (BID) | ORAL | 1 refills | Status: AC

## 2018-08-02 MED ORDER — PREGABALIN 300 MG PO CAPS: 300.0000 mg | ORAL_CAPSULE | Freq: Two times a day (BID) | ORAL | 1 refills | Status: DC

## 2018-08-02 MED ORDER — CYCLOBENZAPRINE HCL 10 MG PO TABS: 10.0000 mg | ORAL_TABLET | Freq: Three times a day (TID) | ORAL | 0 refills | Status: AC | PRN

## 2018-08-02 MED ORDER — OMEPRAZOLE 40 MG PO CPDR: 40.0000 mg | DELAYED_RELEASE_CAPSULE | Freq: Every day | ORAL | 3 refills | Status: AC

## 2018-08-02 MED ORDER — LOSARTAN POTASSIUM-HCTZ 100-25 MG PO TABS: 1.0000 | ORAL_TABLET | Freq: Every day | ORAL | 0 refills | Status: AC

## 2018-08-02 MED ORDER — HYDROCODONE-ACETAMINOPHEN 5-325 MG PO TABS: 1.0000 | ORAL_TABLET | Freq: Four times a day (QID) | ORAL | 0 refills | Status: AC | PRN

## 2018-08-02 MED ORDER — VENLAFAXINE HCL ER 150 MG PO CP24: ORAL_CAPSULE | ORAL | 3 refills | Status: AC

## 2018-08-02 NOTE — Patient Instructions (Signed)
Call to set up an appointment for your back  Dr. Ernestina Patches [facility] Phone [phone]   Call for mammogram and bone density We recommend that you schedule a mammogram for breast cancer screening. Typically, you do not need a referral to do this. Please contact a local imaging center to schedule your mammogram. [facility] ([place]) - [phone] or [phone]

## 2018-08-02 NOTE — Progress Notes (Signed)
Established Patient Office Visit  Subjective:  Patient ID: Bonnie Lawson, female    DOB: 1946-05-25  Age: 72 y.o. MRN: [id number]  CC:  Chief Complaint  Patient presents with  . disc degeneration    6 month f/u .  pt has handicap placard form that needs to be completed  . Medication Refill    lyrica, vicodin, losartan potassium-hctz, levothyroxin, omeprazole, venlafaxine, celecoxib, flexeril, flonase.l    HPI Takiyah Oconnor presents for  Chronic Smoker Started smoking 40 years ago  Currently smoking 1/2 pack a day and uses e-cigs as well She does not smoke in the house but indoors she uses electronic cigarettes She has never been screened for lung cnacer She has a raspy voice and has a daily cough that brings up mucus that is worse in the morning She states that it continues through the day  Hypothyroidism: Patient presents for evaluation of thyroid function. Symptoms consist of denies fatigue, weight changes, heat/cold intolerance, bowel/skin changes or CVS symptoms. The symptoms are no.  The problem has been unchanged.  Previous thyroid studies include TSH. The hypothyroidism is due to hypothyroidism.   Lab Results  Component Value Date   TSH 15.350 (H) 08/02/2018   Hypertension: Patient here for follow-up of elevated blood pressure. She is not exercising and is adherent to low salt diet.  Blood pressure is well controlled at home. She has been out of her bp medications.  Cardiac symptoms none. Patient denies chest pain, chest pressure/discomfort and claudication.  Cardiovascular risk factors: hypertension, obesity (BMI >= 30 kg/m2) and sedentary lifestyle. Use of agents associated with hypertension: NSAIDS. History of target organ damage: none. BP Readings from Last 3 Encounters:  08/02/18 (!) 166/84  01/27/18 138/88  07/29/17 138/70    Chronic pain She reports that her pain medications are used sparingly because of opiate induced constipation She typically takes  tylenol for her pain  She states that she is taking lyrica and intermittently takes norco Last norco lasted 6 months and she was only given 30 tabs  Skin changes/atypical moles She reports skin changes on her ears that are scaly and itchy She reports that they go away and come back and sometimes they bleed She sun exposure but is a chronic smoker  Colon Cancer Screening She had colonoscopy a long time ago She denies blood in his stool, unexpected weight loss or pain with defecation No rectal itching She smokes heavily She does not have a family history of colon cancer   Past Medical History:  Diagnosis Date  . Allergy     Past Surgical History:  Procedure Laterality Date  . NO PAST SURGERIES      Family History  Problem Relation Age of Onset  . Stroke Neg Hx     Social History   Socioeconomic History  . Marital status: Divorced    Spouse name: Not on file  . Number of children: Not on file  . Years of education: Not on file  . Highest education level: Not on file  Occupational History  . Not on file  Social Needs  . Financial resource strain: Not on file  . Food insecurity:    Worry: Not on file    Inability: Not on file  . Transportation needs:    Medical: Not on file    Non-medical: Not on file  Tobacco Use  . Smoking status: Current Every Day Smoker    Packs/day: 0.75    Years: 37.00    Pack  years: 27.75  . Smokeless tobacco: Never Used  Substance and Sexual Activity  . Alcohol use: No  . Drug use: No  . Sexual activity: Not on file  Lifestyle  . Physical activity:    Days per week: Not on file    Minutes per session: Not on file  . Stress: Not on file  Relationships  . Social connections:    Talks on phone: Not on file    Gets together: Not on file    Attends religious service: Not on file    Active member of club or organization: Not on file    Attends meetings of clubs or organizations: Not on file    Relationship status: Not on file  .  Intimate partner violence:    Fear of current or ex partner: Not on file    Emotionally abused: Not on file    Physically abused: Not on file    Forced sexual activity: Not on file  Other Topics Concern  . Not on file  Social History Narrative  . Not on file    Outpatient Medications Prior to Visit  Medication Sig Dispense Refill  . aspirin EC 81 MG tablet Take 81 mg by mouth daily.    . celecoxib (CELEBREX) 200 MG capsule Take 200 mg by mouth 2 (two) times daily.    . cyclobenzaprine (FLEXERIL) 10 MG tablet Take 1 tablet (10 mg total) by mouth 3 (three) times daily as needed for muscle spasms. 30 tablet 0  . fluticasone (FLONASE) 50 MCG/ACT nasal spray Place 2 sprays into both nostrils daily. 16 g 6  . HYDROcodone-acetaminophen (NORCO/VICODIN) 5-325 MG tablet Take 1 tablet by mouth every 6 (six) hours as needed for moderate pain. 30 tablet 0  . levothyroxine (SYNTHROID, LEVOTHROID) 75 MCG tablet TAKE 1 TABLET BY MOUTH DAILY BEFORE BREAKFAST WITH NO OTHER MEDS 90 tablet 1  . losartan-hydrochlorothiazide (HYZAAR) 100-25 MG tablet Take 1 tablet by mouth daily. Needs office visit for labs. No refills. 30 tablet 0  . omeprazole (PRILOSEC) 40 MG capsule TAKE ONE CAPSULE BY MOUTH DAILY 90 capsule 0  . pregabalin (LYRICA) 300 MG capsule Take 1 capsule (300 mg total) by mouth 2 (two) times daily. 180 capsule 1  . venlafaxine XR (EFFEXOR-XR) 150 MG 24 hr capsule TAKE 1 CAPSULE(150 MG) BY MOUTH TWICE DAILY WITH A MEAL 180 capsule 3   No facility-administered medications prior to visit.     Allergies  Allergen Reactions  . Sulfa Antibiotics     ROS Review of Systems See hpi Review of Systems  Constitutional: Negative for activity change, appetite change, chills and fever.  HENT: Negative for congestion, nosebleeds, trouble swallowing and voice change.   Respiratory: Negative for cough, shortness of breath and wheezing.   Gastrointestinal: Negative for diarrhea, nausea and vomiting.    Genitourinary: Negative for difficulty urinating, dysuria, flank pain and hematuria.  Musculoskeletal: Negative for back pain, joint swelling and neck pain.  Neurological: Negative for dizziness, speech difficulty, light-headedness and numbness.  See HPI. All other review of systems negative.     Objective:    Physical Exam  BP (!) 166/84 (BP Location: Right Arm, Patient Position: Sitting, Cuff Size: Normal)   Pulse 84   Temp 98 F (36.7 C) (Oral)   Resp 17   Ht 5' 2.5" (1.588 m)   Wt 189 lb (85.7 kg)   SpO2 97%   BMI 34.02 kg/m  Wt Readings from Last 3 Encounters:  08/02/18 189 lb (  85.7 kg)  01/27/18 186 lb 3.2 oz (84.5 kg)  07/29/17 184 lb 6.4 oz (83.6 kg)   Physical Exam  Constitutional: Oriented to person, place, and time. Appears well-developed and well-nourished.  HENT:  Head: Normocephalic and atraumatic.  Eyes: Conjunctivae and EOM are normal.  Neck: supple, no thyromegaly Cardiovascular: Normal rate, regular rhythm, normal heart sounds and intact distal pulses.  No murmur heard. Pulmonary/Chest: Effort normal and breath sounds normal. No stridor. No respiratory distress. Has no wheezes.  Neurological: Is alert and oriented to person, place, and time.  Skin: Skin is warm. Scaly skin on arms and legs Psychiatric: Has a normal mood and affect. Behavior is normal. Judgment and thought content normal.    Health Maintenance Due  Topic Date Due  . COLONOSCOPY  03/16/1996  . DEXA SCAN  03/17/2011    There are no preventive care reminders to display for this patient.  Lab Results  Component Value Date   TSH 15.350 (H) 08/02/2018   Lab Results  Component Value Date   WBC 9.8 06/29/2017   HGB 14.0 06/29/2017   HCT 42.2 06/29/2017   MCV 86.7 06/29/2017   PLT 301.0 06/29/2017   Lab Results  Component Value Date   NA 143 08/02/2018   K 4.1 08/02/2018   CO2 23 08/02/2018   GLUCOSE 100 (H) 08/02/2018   BUN 16 08/02/2018   CREATININE 0.79 08/02/2018    BILITOT 0.2 08/02/2018   ALKPHOS 131 (H) 08/02/2018   AST 17 08/02/2018   ALT 13 08/02/2018   PROT 6.9 08/02/2018   ALBUMIN 4.1 08/02/2018   CALCIUM 9.3 08/02/2018   ANIONGAP 11 04/23/2016   Lab Results  Component Value Date   CHOL 183 08/02/2018   Lab Results  Component Value Date   HDL 49 08/02/2018   Lab Results  Component Value Date   LDLCALC 109 (H) 08/02/2018   Lab Results  Component Value Date   TRIG 126 08/02/2018   Lab Results  Component Value Date   CHOLHDL 3.7 08/02/2018   No results found for: HGBA1C    Assessment & Plan:   Problem List Items Addressed This Visit      Cardiovascular and Mediastinum   Essential hypertension - Primary  - uncontrolled, refilled meds, pt to return for recheck once meds are back   Relevant Medications   losartan-hydrochlorothiazide (HYZAAR) 100-25 MG tablet     Other   Chronic pain   Relevant Medications   cyclobenzaprine (FLEXERIL) 10 MG tablet   venlafaxine XR (EFFEXOR-XR) 150 MG 24 hr capsule   pregabalin (LYRICA) 300 MG capsule   HYDROcodone-acetaminophen (NORCO/VICODIN) 5-325 MG tablet   Moderate depressive episode (HCC)  - stable on effexor, cpm   Relevant Medications   venlafaxine XR (EFFEXOR-XR) 150 MG 24 hr capsule    Other Visit Diagnoses    Screen for colon cancer    - referral placed for screening    Relevant Orders   Ambulatory referral to Gastroenterology   Family history of osteoporosis in mother       Relevant Orders   DG Bone Density   Other intervertebral disc degeneration, lumbar region       Relevant Medications   cyclobenzaprine (FLEXERIL) 10 MG tablet   pregabalin (LYRICA) 300 MG capsule   HYDROcodone-acetaminophen (NORCO/VICODIN) 5-325 MG tablet   Encounter for medication monitoring       Relevant Orders   Comprehensive metabolic panel (Completed)   Lipid panel (Completed)   Estrogen deficiency    -  pt who is a caucasian female, with smoking history and estrogen deficiency Will screen  for cancers and bone density   Relevant Orders   DG Bone Density   MM Digital Screening   Nicotine dependence with current use       Smoker       Relevant Orders   Ambulatory referral to Gastroenterology   DG Bone Density   Screening for breast cancer       Relevant Orders   MM Digital Screening   Acquired hypothyroidism    - increased levothyroxine from 100mcg to 75mcg   Relevant Medications   levothyroxine (SYNTHROID, LEVOTHROID) 88 MCG tablet   Other Relevant Orders   TSH (Completed)   Encounter for screening for malignant neoplasm of respiratory organs    - based on chronic smoking will screen for lung cancer   Relevant Orders   CT CHEST LUNG CA SCREEN LOW DOSE W/O CM   Atypical nevi       Relevant Orders   Ambulatory referral to Dermatology   Screening for skin cancer    -  Advised follow up with Dermatology for screening   Relevant Orders   Ambulatory referral to Dermatology      Meds ordered this encounter  Medications  . DISCONTD: pregabalin (LYRICA) 300 MG capsule    Sig: Take 1 capsule (300 mg total) by mouth 2 (two) times daily.    Dispense:  180 capsule    Refill:  1  . losartan-hydrochlorothiazide (HYZAAR) 100-25 MG tablet    Sig: Take 1 tablet by mouth daily.    Dispense:  90 tablet    Refill:  0  . cyclobenzaprine (FLEXERIL) 10 MG tablet    Sig: Take 1 tablet (10 mg total) by mouth 3 (three) times daily as needed for muscle spasms.    Dispense:  90 tablet    Refill:  0  . fluticasone (FLONASE) 50 MCG/ACT nasal spray    Sig: Place 2 sprays into both nostrils daily.    Dispense:  16 g    Refill:  6  . venlafaxine XR (EFFEXOR-XR) 150 MG 24 hr capsule    Sig: TAKE 1 CAPSULE(150 MG) BY MOUTH TWICE DAILY WITH A MEAL    Dispense:  180 capsule    Refill:  3    **Patient requests 90 days supply**  . omeprazole (PRILOSEC) 40 MG capsule    Sig: Take 1 capsule (40 mg total) by mouth daily.    Dispense:  90 capsule    Refill:  3  . DISCONTD:  HYDROcodone-acetaminophen (NORCO/VICODIN) 5-325 MG tablet    Sig: Take 1 tablet by mouth every 6 (six) hours as needed for moderate pain.    Dispense:  30 tablet    Refill:  0  . pregabalin (LYRICA) 300 MG capsule    Sig: Take 1 capsule (300 mg total) by mouth 2 (two) times daily.    Dispense:  180 capsule    Refill:  1  . HYDROcodone-acetaminophen (NORCO/VICODIN) 5-325 MG tablet    Sig: Take 1 tablet by mouth every 6 (six) hours as needed for moderate pain.    Dispense:  30 tablet    Refill:  0  . levothyroxine (SYNTHROID, LEVOTHROID) 88 MCG tablet    Sig: TAKE 1 TABLET BY MOUTH DAILY BEFORE BREAKFAST WITH NO OTHER MEDS    Dispense:  90 tablet    Refill:  0    Follow-up: Return in about 3 months (around 11/01/2018) for medicare  wellness exam.    Forrest Moron, MD

## 2018-08-03 LAB — COMPREHENSIVE METABOLIC PANEL
ALBUMIN: 4.1 g/dL (ref 3.5–4.8)
ALK PHOS: 131 IU/L — AB (ref 39–117)
ALT: 13 IU/L (ref 0–32)
AST: 17 IU/L (ref 0–40)
Albumin/Globulin Ratio: 1.5 (ref 1.2–2.2)
BUN/Creatinine Ratio: 20 (ref 12–28)
BUN: 16 mg/dL (ref 8–27)
Bilirubin Total: 0.2 mg/dL (ref 0.0–1.2)
CALCIUM: 9.3 mg/dL (ref 8.7–10.3)
CO2: 23 mmol/L (ref 20–29)
CREATININE: 0.79 mg/dL (ref 0.57–1.00)
Chloride: 102 mmol/L (ref 96–106)
GFR calc Af Amer: 86 mL/min/{1.73_m2} (ref >59)
GFR, EST NON AFRICAN AMERICAN: 75 mL/min/{1.73_m2} (ref >59)
GLUCOSE: 100 mg/dL — AB (ref 65–99)
Globulin, Total: 2.8 g/dL (ref 1.5–4.5)
Potassium: 4.1 mmol/L (ref 3.5–5.2)
Sodium: 143 mmol/L (ref 134–144)
Total Protein: 6.9 g/dL (ref 6.0–8.5)

## 2018-08-03 LAB — LIPID PANEL
CHOL/HDL RATIO: 3.7 ratio (ref 0.0–4.4)
CHOLESTEROL TOTAL: 183 mg/dL (ref 100–199)
HDL: 49 mg/dL (ref >39)
LDL Calculated: 109 mg/dL — ABNORMAL HIGH (ref 0–99)
TRIGLYCERIDES: 126 mg/dL (ref 0–149)
VLDL CHOLESTEROL CAL: 25 mg/dL (ref 5–40)

## 2018-08-03 LAB — TSH: TSH: 15.35 u[IU]/mL — ABNORMAL HIGH (ref 0.450–4.500)

## 2018-08-05 MED ORDER — LEVOTHYROXINE SODIUM 88 MCG PO TABS: ORAL_TABLET | ORAL | 0 refills | Status: AC

## 2018-08-24 ENCOUNTER — Ambulatory Visit (INDEPENDENT_AMBULATORY_CARE_PROVIDER_SITE_OTHER): Payer: Medicare HMO | Admitting: Emergency Medicine

## 2018-08-24 ENCOUNTER — Ambulatory Visit (INDEPENDENT_AMBULATORY_CARE_PROVIDER_SITE_OTHER): Payer: Medicare HMO

## 2018-08-24 ENCOUNTER — Other Ambulatory Visit: Payer: Self-pay

## 2018-08-24 ENCOUNTER — Other Ambulatory Visit: Payer: Self-pay | Admitting: Family Medicine

## 2018-08-24 ENCOUNTER — Encounter: Payer: Self-pay | Admitting: Emergency Medicine

## 2018-08-24 VITALS — BP 118/70 | HR 89 | Temp 98.3°F | Resp 20 | Ht 63.39 in | Wt 183.6 lb

## 2018-08-24 DIAGNOSIS — J22 Unspecified acute lower respiratory infection: Secondary | ICD-10-CM | POA: Diagnosis not present

## 2018-08-24 DIAGNOSIS — R05 Cough: Secondary | ICD-10-CM

## 2018-08-24 DIAGNOSIS — R062 Wheezing: Secondary | ICD-10-CM

## 2018-08-24 DIAGNOSIS — R059 Cough, unspecified: Secondary | ICD-10-CM

## 2018-08-24 MED ORDER — BENZONATATE 200 MG PO CAPS: 200.0000 mg | ORAL_CAPSULE | Freq: Two times a day (BID) | ORAL | 0 refills | Status: AC | PRN

## 2018-08-24 MED ORDER — PREDNISONE 20 MG PO TABS: 40.0000 mg | ORAL_TABLET | Freq: Every day | ORAL | 0 refills | Status: AC

## 2018-08-24 MED ORDER — AZITHROMYCIN 250 MG PO TABS: ORAL_TABLET | ORAL | 0 refills | Status: DC

## 2018-08-24 NOTE — Patient Instructions (Addendum)
     If you have lab work done today you will be contacted with your lab results within the next 2 weeks.  If you have not heard from [place] then please contact us. The fastest way to get your results is to register for My Chart.   IF you received an x-ray today, you will receive an invoice from [facility]. Please contact [facility] at [phone] with questions or concerns Cough, Adult  A cough helps to clear your throat and lungs. A cough may last only 2-3 weeks (acute), or it may last longer than 8 weeks (chronic). Many different things can cause a cough. A cough may be a sign of an illness or another medical condition. Follow these instructions at home:  Pay attention to any changes in your cough.  Take medicines only as told by your doctor. ? If you were prescribed an antibiotic medicine, take it as told by your doctor. Do not stop taking it even if you start to feel better. ? Talk with your doctor before you try using a cough medicine.  Drink enough fluid to keep your pee (urine) clear or pale yellow.  If the air is dry, use a cold steam vaporizer or humidifier in your home.  Stay away from things that make you cough at work or at home.  If your cough is worse at night, try using extra pillows to raise your head up higher while you sleep.  Do not smoke, and try not to be around smoke. If you need help quitting, ask your doctor.  Do not have caffeine.  Do not drink alcohol.  Rest as needed. Contact a doctor if:  You have new problems (symptoms).  You cough up yellow fluid (pus).  Your cough does not get better after 2-3 weeks, or your cough gets worse.  Medicine does not help your cough and you are not sleeping well.  You have pain that gets worse or pain that is not helped with medicine.  You have a fever.  You are losing weight and you do not know why.  You have night sweats. Get help right away if:  You cough up blood.  You have trouble  breathing.  Your heartbeat is very fast. This information is not intended to replace advice given to you by your health care provider. Make sure you discuss any questions you have with your health care provider. Document Released: 04/17/2011 Document Revised: 01/10/2016 Document Reviewed: 10/11/2014 Elsevier Interactive Patient Education  2019 [employer].  regarding your invoice.   IF you received labwork today, you will receive an invoice from [facility]. Please contact [facility] at [phone] with questions or concerns regarding your invoice.   Our billing staff will not be able to assist you with questions regarding bills from these companies.  You will be contacted with the lab results as soon as they are available. The fastest way to get your results is to activate your My Chart account. Instructions are located on the last page of this paperwork. If you have not heard from [place] regarding the results in 2 weeks, please contact this office.

## 2018-08-24 NOTE — Progress Notes (Signed)
Bonnie Lawson 73 y.o.   Chief Complaint  Patient presents with  . Cough    X 8 days-  congestion, mucus, runny nose    HISTORY OF PRESENT ILLNESS: This is a 73 y.o. female complaining of flulike symptoms that started 8 days ago with productive cough and congestion, feeling tired, wheezing.  Denies difficulty breathing or chest pain.  Able to eat and drink.  Denies nausea or vomiting.  Denies fever or chills.  HPI   Prior to Admission medications   Medication Sig Start Date End Date Taking? Authorizing Provider  aspirin EC 81 MG tablet Take 81 mg by mouth daily.   Yes [provider]  celecoxib (CELEBREX) 200 MG capsule Take 200 mg by mouth 2 (two) times daily.   Yes [provider]  cyclobenzaprine (FLEXERIL) 10 MG tablet Take 1 tablet (10 mg total) by mouth 3 (three) times daily as needed for muscle spasms. 08/02/18  Yes Stallings, Zoe A, MD  fluticasone (FLONASE) 50 MCG/ACT nasal spray Place 2 sprays into both nostrils daily. 08/02/18  Yes Forrest Moron, MD  HYDROcodone-acetaminophen (NORCO/VICODIN) 5-325 MG tablet Take 1 tablet by mouth every 6 (six) hours as needed for moderate pain. 08/02/18  Yes Stallings, Zoe A, MD  levothyroxine (SYNTHROID, LEVOTHROID) 88 MCG tablet TAKE 1 TABLET BY MOUTH DAILY BEFORE BREAKFAST WITH NO OTHER MEDS 08/05/18  Yes Stallings, Zoe A, MD  losartan-hydrochlorothiazide (HYZAAR) 100-25 MG tablet Take 1 tablet by mouth daily. 08/02/18  Yes Forrest Moron, MD  omeprazole (PRILOSEC) 40 MG capsule Take 1 capsule (40 mg total) by mouth daily. 08/02/18  Yes Delia Chimes A, MD  pregabalin (LYRICA) 300 MG capsule Take 1 capsule (300 mg total) by mouth 2 (two) times daily. 08/02/18  Yes Delia Chimes A, MD  venlafaxine XR (EFFEXOR-XR) 150 MG 24 hr capsule TAKE 1 CAPSULE(150 MG) BY MOUTH TWICE DAILY WITH A MEAL 08/02/18  Yes Forrest Moron, MD    Allergies  Allergen Reactions  . Sulfa Antibiotics     Patient Active Problem List   Diagnosis Date Noted  . Chronic left-sided low back pain with left-sided sciatica 05/25/2017  . Upper airway cough syndrome 05/18/2017  . Nocturnal hypoxemia 11/06/2016  . Moderate depressive episode ([place]) 11/06/2016  . Menopause 11/06/2016  . Cigarette smoker 11/06/2016  . Uncontrolled hypertension 11/05/2016  . Abdominal pain, chronic, epigastric 04/25/2016  . Abdominal bloating 04/25/2016  . Leukocytosis 04/25/2016  . Insomnia 04/25/2016  . Essential hypertension 04/25/2016  . Chronic pain 04/25/2016    Past Medical History:  Diagnosis Date  . Allergy     Past Surgical History:  Procedure Laterality Date  . ABDOMINAL HYSTERECTOMY     per patient age 40's  . ANKLE FRACTURE SURGERY Left   . BACK SURGERY  2008  . NASAL SEPTUM SURGERY     per patient late 20's  . NO PAST SURGERIES    . REPAIR OF PERFORATED ULCER  2008  . TONSILLECTOMY     childhood  . TUBAL LIGATION     per patient age 51's    Social History   Socioeconomic History  . Marital status: Divorced    Spouse name: Not on file  . Number of children: 2  . Years of education: Not on file  . Highest education level: Not on file  Occupational History  . Not on file  Social Needs  . Financial resource strain: Not on file  . Food insecurity:    Worry: Not  on file    Inability: Not on file  . Transportation needs:    Medical: Not on file    Non-medical: Not on file  Tobacco Use  . Smoking status: Current Every Day Smoker    Packs/day: 0.75    Years: 37.00    Pack years: 27.75    Types: Cigarettes  . Smokeless tobacco: Never Used  Substance and Sexual Activity  . Alcohol use: Yes    Alcohol/week: 2.0 standard drinks    Types: 1 Glasses of wine, 1 Cans of beer per week    Comment: occ  . Drug use: No  . Sexual activity: Not Currently  Lifestyle  . Physical activity:    Days per week: Not on file    Minutes per session: Not on file  . Stress: Not on file  Relationships  . Social connections:      Talks on phone: Not on file    Gets together: Not on file    Attends religious service: Not on file    Active member of club or organization: Not on file    Attends meetings of clubs or organizations: Not on file    Relationship status: Not on file  . Intimate partner violence:    Fear of current or ex partner: Not on file    Emotionally abused: Not on file    Physically abused: Not on file    Forced sexual activity: Not on file  Other Topics Concern  . Not on file  Social History Narrative  . Not on file    Family History  Problem Relation Age of Onset  . Stroke Neg Hx      Review of Systems  Constitutional: Negative for chills and fever.  HENT: Positive for congestion. Negative for ear pain and sore throat.   Eyes: Negative.   Respiratory: Positive for cough, sputum production and wheezing. Negative for shortness of breath.   Cardiovascular: Negative.  Negative for chest pain, palpitations and leg swelling.  Gastrointestinal: Negative.  Negative for abdominal pain, diarrhea, nausea and vomiting.  Genitourinary: Negative.  Negative for dysuria.  Musculoskeletal: Negative.  Negative for back pain, myalgias and neck pain.  Skin: Negative for rash.  Neurological: Negative.  Negative for dizziness and headaches.  Endo/Heme/Allergies: Negative.   All other systems reviewed and are negative.   Vitals:   08/24/18 1114  BP: (!) 142/83  Pulse: 89  Resp: 20  Temp: 98.3 F (36.8 C)  SpO2: 95%    Physical Exam Vitals signs reviewed.  Constitutional:      Appearance: Normal appearance.  HENT:     Head: Normocephalic and atraumatic.     Nose: Nose normal.     Mouth/Throat:     Mouth: Mucous membranes are moist.     Pharynx: Oropharynx is clear.  Eyes:     Extraocular Movements: Extraocular movements intact.     Conjunctiva/sclera: Conjunctivae normal.     Pupils: Pupils are equal, round, and reactive to light.  Neck:     Musculoskeletal: Normal range of motion  and neck supple.  Cardiovascular:     Rate and Rhythm: Normal rate and regular rhythm.     Heart sounds: Normal heart sounds.  Pulmonary:     Effort: Pulmonary effort is normal. No respiratory distress.     Breath sounds: Wheezing and rhonchi present. No rales.  Musculoskeletal: Normal range of motion.  Lymphadenopathy:     Cervical: No cervical adenopathy.  Skin:    General:  Skin is warm and dry.     Capillary Refill: Capillary refill takes less than 2 seconds.  Neurological:     General: No focal deficit present.     Mental Status: She is alert and oriented to person, place, and time.     Sensory: No sensory deficit.     Motor: No weakness.  Psychiatric:        Mood and Affect: Mood normal.        Behavior: Behavior normal.    Dg Chest 2 View  Result Date: 08/24/2018 CLINICAL DATA:  Cough EXAM: CHEST - 2 VIEW COMPARISON:  05/18/2017 FINDINGS: Cardiac shadows within normal limits. Aortic calcifications are again seen. The lungs are well aerated bilaterally. No focal infiltrate or sizable effusion is seen. Degenerative changes of the thoracic spine are noted. IMPRESSION: No acute abnormality noted. Electronically Signed   By: Inez Catalina M.D.   On: 08/24/2018 12:06     ASSESSMENT & PLAN: Sherion was seen today for cough.  Diagnoses and all orders for this visit:  Cough -     DG Chest 2 View; Future -     benzonatate (TESSALON) 200 MG capsule; Take 1 capsule (200 mg total) by mouth 2 (two) times daily as needed for cough.  Wheezing -     predniSONE (DELTASONE) 20 MG tablet; Take 2 tablets (40 mg total) by mouth daily with breakfast for 5 days.  Lower respiratory infection -     azithromycin (ZITHROMAX) 250 MG tablet; Sig as indicated    Patient Instructions       If you have lab work done today you will be contacted with your lab results within the next 2 weeks.  If you have not heard from [place] then please contact us. The fastest way to get your results is to register  for My Chart.   IF you received an x-ray today, you will receive an invoice from [facility]. Please contact [facility] at [phone] with questions or concerns Cough, Adult  A cough helps to clear your throat and lungs. A cough may last only 2-3 weeks (acute), or it may last longer than 8 weeks (chronic). Many different things can cause a cough. A cough may be a sign of an illness or another medical condition. Follow these instructions at home:  Pay attention to any changes in your cough.  Take medicines only as told by your doctor. ? If you were prescribed an antibiotic medicine, take it as told by your doctor. Do not stop taking it even if you start to feel better. ? Talk with your doctor before you try using a cough medicine.  Drink enough fluid to keep your pee (urine) clear or pale yellow.  If the air is dry, use a cold steam vaporizer or humidifier in your home.  Stay away from things that make you cough at work or at home.  If your cough is worse at night, try using extra pillows to raise your head up higher while you sleep.  Do not smoke, and try not to be around smoke. If you need help quitting, ask your doctor.  Do not have caffeine.  Do not drink alcohol.  Rest as needed. Contact a doctor if:  You have new problems (symptoms).  You cough up yellow fluid (pus).  Your cough does not get better after 2-3 weeks, or your cough gets worse.  Medicine does not help your cough and you are not sleeping well.  You have  pain that gets worse or pain that is not helped with medicine.  You have a fever.  You are losing weight and you do not know why.  You have night sweats. Get help right away if:  You cough up blood.  You have trouble breathing.  Your heartbeat is very fast. This information is not intended to replace advice given to you by your health care provider. Make sure you discuss any questions you have with your health care  provider. Document Released: 04/17/2011 Document Revised: 01/10/2016 Document Reviewed: 10/11/2014 Elsevier Interactive Patient Education  2019 [employer].  regarding your invoice.   IF you received labwork today, you will receive an invoice from [facility]. Please contact [facility] at [phone] with questions or concerns regarding your invoice.   Our billing staff will not be able to assist you with questions regarding bills from these companies.  You will be contacted with the lab results as soon as they are available. The fastest way to get your results is to activate your My Chart account. Instructions are located on the last page of this paperwork. If you have not heard from [place] regarding the results in 2 weeks, please contact this office.         Agustina Caroli, MD [facility]

## 2018-08-31 ENCOUNTER — Ambulatory Visit: Payer: Self-pay | Admitting: *Deleted

## 2018-08-31 NOTE — Telephone Encounter (Signed)
Pt having complaints of pressure in the middle of chest that started on yesterday and has continued today. Pt denies any dizziness and difficulty breathing but states that she does have nausea off and on. Pt was diagnosed with bronchitis on 08/24/18 and pt states she is still having mucus production that has not improved. Pt states she can feel the pressure in her chest with lying down and it also goes into her "wing bones". Pt states "I thought I was having a heart attack last night but I guess I wasn't".Pt advised to seek treatment in the ED for current symptoms. Pt verbalized understanding.   Reason for Disposition . Patient sounds very sick or weak to the triager  Answer Assessment - Initial Assessment Questions 1. LOCATION: "Where does it hurt?"       In the middle of chest 2. RADIATION: "Does the pain go anywhere else?" (e.g., into neck, jaw, arms, back)     No but does states she feels the pressure in her "wing bones" when lying down 3. ONSET: "When did the chest pain begin?" (Minutes, hours or days)     On yesterday 4. PATTERN "Does the pain come and go, or has it been constant since it started?"  "Does it get worse with exertion?"      constant 5. DURATION: "How long does it last" (e.g., seconds, minutes, hours)    constant 6. SEVERITY: "How bad is the pain?"  (e.g., Scale 1-10; mild, moderate, or severe)    - MILD (1-3): doesn't interfere with normal activities     - MODERATE (4-7): interferes with normal activities or awakens from sleep    - SEVERE (8-10): excruciating pain, unable to do any normal activities       5-6 7. CARDIAC RISK FACTORS: "Do you have any history of heart problems or risk factors for heart disease?" (e.g., prior heart attack, angina; high blood pressure, diabetes, being overweight, high cholesterol, smoking, or strong family history of heart disease)     HTN, smoker 8. PULMONARY RISK FACTORS: "Do you have any history of lung disease?"  (e.g., blood clots in lung,  asthma, emphysema, birth control pills)     No 9. CAUSE: "What do you think is causing the chest pain?"     unsure 10. OTHER SYMPTOMS: "Do you have any other symptoms?" (e.g., dizziness, nausea, vomiting, sweating, fever, difficulty breathing, cough)       Has nausea off and on since yesterday 11. PREGNANCY: "Is there any chance you are pregnant?" "When was your last menstrual period?"       n/a  Protocols used: CHEST PAIN-A-AH

## 2018-09-01 DIAGNOSIS — R1013 Epigastric pain: Secondary | ICD-10-CM | POA: Diagnosis not present

## 2018-09-01 DIAGNOSIS — R69 Illness, unspecified: Secondary | ICD-10-CM | POA: Diagnosis not present

## 2018-09-01 DIAGNOSIS — R799 Abnormal finding of blood chemistry, unspecified: Secondary | ICD-10-CM | POA: Diagnosis not present

## 2018-09-02 ENCOUNTER — Emergency Department (HOSPITAL_COMMUNITY): Payer: Medicare HMO

## 2018-09-02 ENCOUNTER — Encounter (HOSPITAL_COMMUNITY): Payer: Self-pay | Admitting: Emergency Medicine

## 2018-09-02 ENCOUNTER — Encounter (HOSPITAL_COMMUNITY): Admission: EM | Disposition: E | Payer: Self-pay | Source: Home / Self Care | Attending: Interventional Cardiology

## 2018-09-02 ENCOUNTER — Other Ambulatory Visit: Payer: Self-pay

## 2018-09-02 DIAGNOSIS — I309 Acute pericarditis, unspecified: Secondary | ICD-10-CM | POA: Diagnosis not present

## 2018-09-02 DIAGNOSIS — Z8249 Family history of ischemic heart disease and other diseases of the circulatory system: Secondary | ICD-10-CM

## 2018-09-02 DIAGNOSIS — K5730 Diverticulosis of large intestine without perforation or abscess without bleeding: Secondary | ICD-10-CM | POA: Diagnosis not present

## 2018-09-02 DIAGNOSIS — Z7982 Long term (current) use of aspirin: Secondary | ICD-10-CM | POA: Diagnosis not present

## 2018-09-02 DIAGNOSIS — I469 Cardiac arrest, cause unspecified: Secondary | ICD-10-CM | POA: Diagnosis not present

## 2018-09-02 DIAGNOSIS — Z79899 Other long term (current) drug therapy: Secondary | ICD-10-CM | POA: Diagnosis not present

## 2018-09-02 DIAGNOSIS — I2582 Chronic total occlusion of coronary artery: Secondary | ICD-10-CM | POA: Diagnosis present

## 2018-09-02 DIAGNOSIS — Z79890 Hormone replacement therapy (postmenopausal): Secondary | ICD-10-CM | POA: Diagnosis not present

## 2018-09-02 DIAGNOSIS — R110 Nausea: Secondary | ICD-10-CM | POA: Diagnosis not present

## 2018-09-02 DIAGNOSIS — Z79891 Long term (current) use of opiate analgesic: Secondary | ICD-10-CM

## 2018-09-02 DIAGNOSIS — Z6832 Body mass index (BMI) 32.0-32.9, adult: Secondary | ICD-10-CM

## 2018-09-02 DIAGNOSIS — R001 Bradycardia, unspecified: Secondary | ICD-10-CM | POA: Diagnosis not present

## 2018-09-02 DIAGNOSIS — K2970 Gastritis, unspecified, without bleeding: Secondary | ICD-10-CM | POA: Diagnosis not present

## 2018-09-02 DIAGNOSIS — I233 Rupture of cardiac wall without hemopericardium as current complication following acute myocardial infarction: Secondary | ICD-10-CM | POA: Diagnosis present

## 2018-09-02 DIAGNOSIS — I10 Essential (primary) hypertension: Secondary | ICD-10-CM | POA: Diagnosis present

## 2018-09-02 DIAGNOSIS — E669 Obesity, unspecified: Secondary | ICD-10-CM | POA: Diagnosis present

## 2018-09-02 DIAGNOSIS — K5790 Diverticulosis of intestine, part unspecified, without perforation or abscess without bleeding: Secondary | ICD-10-CM | POA: Diagnosis present

## 2018-09-02 DIAGNOSIS — Z882 Allergy status to sulfonamides status: Secondary | ICD-10-CM

## 2018-09-02 DIAGNOSIS — R079 Chest pain, unspecified: Secondary | ICD-10-CM | POA: Diagnosis not present

## 2018-09-02 DIAGNOSIS — R1013 Epigastric pain: Secondary | ICD-10-CM

## 2018-09-02 DIAGNOSIS — I214 Non-ST elevation (NSTEMI) myocardial infarction: Secondary | ICD-10-CM | POA: Diagnosis not present

## 2018-09-02 DIAGNOSIS — M549 Dorsalgia, unspecified: Secondary | ICD-10-CM | POA: Diagnosis not present

## 2018-09-02 DIAGNOSIS — G894 Chronic pain syndrome: Secondary | ICD-10-CM | POA: Diagnosis present

## 2018-09-02 DIAGNOSIS — F17210 Nicotine dependence, cigarettes, uncomplicated: Secondary | ICD-10-CM | POA: Diagnosis present

## 2018-09-02 DIAGNOSIS — R69 Illness, unspecified: Secondary | ICD-10-CM | POA: Diagnosis not present

## 2018-09-02 DIAGNOSIS — J40 Bronchitis, not specified as acute or chronic: Secondary | ICD-10-CM | POA: Diagnosis not present

## 2018-09-02 DIAGNOSIS — I314 Cardiac tamponade: Secondary | ICD-10-CM | POA: Diagnosis present

## 2018-09-02 DIAGNOSIS — I2510 Atherosclerotic heart disease of native coronary artery without angina pectoris: Secondary | ICD-10-CM | POA: Diagnosis not present

## 2018-09-02 DIAGNOSIS — K439 Ventral hernia without obstruction or gangrene: Secondary | ICD-10-CM | POA: Diagnosis present

## 2018-09-02 DIAGNOSIS — Z8711 Personal history of peptic ulcer disease: Secondary | ICD-10-CM

## 2018-09-02 DIAGNOSIS — Z90710 Acquired absence of both cervix and uterus: Secondary | ICD-10-CM

## 2018-09-02 DIAGNOSIS — R109 Unspecified abdominal pain: Secondary | ICD-10-CM | POA: Diagnosis not present

## 2018-09-02 DIAGNOSIS — K219 Gastro-esophageal reflux disease without esophagitis: Secondary | ICD-10-CM | POA: Diagnosis present

## 2018-09-02 DIAGNOSIS — G8929 Other chronic pain: Secondary | ICD-10-CM | POA: Diagnosis present

## 2018-09-02 HISTORY — PX: CARDIAC CATHETERIZATION: SHX172

## 2018-09-02 HISTORY — DX: Other specified cough: R05.8

## 2018-09-02 HISTORY — DX: Essential (primary) hypertension: I10

## 2018-09-02 HISTORY — DX: Non-ST elevation (NSTEMI) myocardial infarction: I21.4

## 2018-09-02 HISTORY — DX: Cough: R05

## 2018-09-02 HISTORY — DX: Gastric ulcer, unspecified as acute or chronic, without hemorrhage or perforation: K25.9

## 2018-09-02 HISTORY — DX: Tobacco use: Z72.0

## 2018-09-02 HISTORY — PX: LEFT HEART CATH AND CORONARY ANGIOGRAPHY: CATH118249

## 2018-09-02 HISTORY — DX: Other chronic pain: G89.29

## 2018-09-02 LAB — APTT: aPTT: 31 seconds (ref 24–36)

## 2018-09-02 LAB — CBC WITH DIFFERENTIAL/PLATELET
Abs Immature Granulocytes: 0.18 10*3/uL — ABNORMAL HIGH (ref 0.00–0.07)
BASOS PCT: 0 %
Basophils Absolute: 0 10*3/uL (ref 0.0–0.1)
Eosinophils Absolute: 0.1 10*3/uL (ref 0.0–0.5)
Eosinophils Relative: 0 %
HCT: 41.8 % (ref 36.0–46.0)
HEMOGLOBIN: 13.7 g/dL (ref 12.0–15.0)
Immature Granulocytes: 1 %
Lymphocytes Relative: 14 %
Lymphs Abs: 2.3 10*3/uL (ref 0.7–4.0)
MCH: 29.5 pg (ref 26.0–34.0)
MCHC: 32.8 g/dL (ref 30.0–36.0)
MCV: 90.1 fL (ref 80.0–100.0)
Monocytes Absolute: 0.9 10*3/uL (ref 0.1–1.0)
Monocytes Relative: 5 %
Neutro Abs: 13.3 10*3/uL — ABNORMAL HIGH (ref 1.7–7.7)
Neutrophils Relative %: 80 %
Platelets: 290 10*3/uL (ref 150–400)
RBC: 4.64 MIL/uL (ref 3.87–5.11)
RDW: 15.9 % — ABNORMAL HIGH (ref 11.5–15.5)
WBC: 16.8 10*3/uL — ABNORMAL HIGH (ref 4.0–10.5)
nRBC: 0.2 % (ref 0.0–0.2)

## 2018-09-02 LAB — TROPONIN I: Troponin I: 10.38 ng/mL (ref <0.03)

## 2018-09-02 LAB — COMPREHENSIVE METABOLIC PANEL
ALT: 22 U/L (ref 0–44)
AST: 68 U/L — ABNORMAL HIGH (ref 15–41)
Albumin: 3.7 g/dL (ref 3.5–5.0)
Alkaline Phosphatase: 94 U/L (ref 38–126)
Anion gap: 13 (ref 5–15)
BUN: 21 mg/dL (ref 8–23)
CO2: 25 mmol/L (ref 22–32)
Calcium: 8.5 mg/dL — ABNORMAL LOW (ref 8.9–10.3)
Chloride: 95 mmol/L — ABNORMAL LOW (ref 98–111)
Creatinine, Ser: 1 mg/dL (ref 0.44–1.00)
GFR calc non Af Amer: 56 mL/min — ABNORMAL LOW (ref >60)
Glucose, Bld: 192 mg/dL — ABNORMAL HIGH (ref 70–99)
Potassium: 3.2 mmol/L — ABNORMAL LOW (ref 3.5–5.1)
Sodium: 133 mmol/L — ABNORMAL LOW (ref 135–145)
Total Bilirubin: 0.5 mg/dL (ref 0.3–1.2)
Total Protein: 7.7 g/dL (ref 6.5–8.1)

## 2018-09-02 LAB — LIPASE, BLOOD: LIPASE: 34 U/L (ref 11–51)

## 2018-09-02 LAB — I-STAT TROPONIN, ED
TROPONIN I, POC: 8.49 ng/mL — AB (ref 0.00–0.08)
Troponin i, poc: 8.72 ng/mL (ref 0.00–0.08)

## 2018-09-02 LAB — PROTIME-INR
INR: 0.9
Prothrombin Time: 12 seconds (ref 11.4–15.2)

## 2018-09-02 SURGERY — LEFT HEART CATH AND CORONARY ANGIOGRAPHY
Anesthesia: LOCAL

## 2018-09-02 MED ORDER — SODIUM CHLORIDE 0.9% FLUSH: 3.0000 mL | Freq: Two times a day (BID) | INTRAVENOUS | Status: DC

## 2018-09-02 MED ORDER — METOPROLOL SUCCINATE ER 25 MG PO TB24
25.0000 mg | ORAL_TABLET | Freq: Every day | ORAL | Status: DC
  Filled 2018-09-02: qty 1

## 2018-09-02 MED ORDER — LEVOTHYROXINE SODIUM 88 MCG PO TABS: 88.0000 ug | ORAL_TABLET | Freq: Every day | ORAL | Status: DC

## 2018-09-02 MED ORDER — ONDANSETRON HCL 4 MG/2ML IJ SOLN: 4.0000 mg | Freq: Four times a day (QID) | INTRAMUSCULAR | Status: DC | PRN

## 2018-09-02 MED ORDER — FENTANYL CITRATE (PF) 100 MCG/2ML IJ SOLN
INTRAMUSCULAR | Status: DC | PRN
  Administered 2018-09-02 (×2): 25 ug via INTRAVENOUS

## 2018-09-02 MED ORDER — HEPARIN (PORCINE) 25000 UT/250ML-% IV SOLN
850.0000 [IU]/h | INTRAVENOUS | Status: DC
  Administered 2018-09-02: 850 [IU]/h via INTRAVENOUS
  Filled 2018-09-02: qty 250

## 2018-09-02 MED ORDER — SODIUM CHLORIDE 0.9 % IV SOLN: 250.0000 mL | INTRAVENOUS | Status: DC | PRN

## 2018-09-02 MED ORDER — FENTANYL CITRATE (PF) 100 MCG/2ML IJ SOLN
INTRAMUSCULAR | Status: AC
  Filled 2018-09-02: qty 2

## 2018-09-02 MED ORDER — SODIUM CHLORIDE 0.9 % IV SOLN
INTRAVENOUS | Status: DC
  Administered 2018-09-02: 10:00:00 via INTRAVENOUS

## 2018-09-02 MED ORDER — ATORVASTATIN CALCIUM 80 MG PO TABS
80.0000 mg | ORAL_TABLET | Freq: Every day | ORAL | Status: DC
  Filled 2018-09-02: qty 1

## 2018-09-02 MED ORDER — ENOXAPARIN SODIUM 40 MG/0.4ML ~~LOC~~ SOLN: 40.0000 mg | SUBCUTANEOUS | Status: DC

## 2018-09-02 MED ORDER — ONDANSETRON HCL 4 MG/2ML IJ SOLN
4.0000 mg | Freq: Once | INTRAMUSCULAR | Status: AC
  Administered 2018-09-02: 4 mg via INTRAVENOUS
  Filled 2018-09-02: qty 2

## 2018-09-02 MED ORDER — NITROGLYCERIN 0.4 MG SL SUBL
0.4000 mg | SUBLINGUAL_TABLET | SUBLINGUAL | Status: DC | PRN
  Administered 2018-09-02: 0.4 mg via SUBLINGUAL
  Filled 2018-09-02: qty 1

## 2018-09-02 MED ORDER — POTASSIUM CHLORIDE CRYS ER 20 MEQ PO TBCR
40.0000 meq | EXTENDED_RELEASE_TABLET | ORAL | Status: AC
  Administered 2018-09-02: 40 meq via ORAL
  Filled 2018-09-02: qty 2

## 2018-09-02 MED ORDER — ZOLPIDEM TARTRATE 5 MG PO TABS: 5.0000 mg | ORAL_TABLET | Freq: Every evening | ORAL | Status: DC | PRN

## 2018-09-02 MED ORDER — HEPARIN SODIUM (PORCINE) 1000 UNIT/ML IJ SOLN
INTRAMUSCULAR | Status: AC
  Filled 2018-09-02: qty 1

## 2018-09-02 MED ORDER — LIDOCAINE HCL (PF) 1 % IJ SOLN
INTRAMUSCULAR | Status: DC | PRN
  Administered 2018-09-02: 2 mL via INTRADERMAL

## 2018-09-02 MED ORDER — HEPARIN BOLUS VIA INFUSION
4000.0000 [IU] | Freq: Once | INTRAVENOUS | Status: AC
  Administered 2018-09-02: 4000 [IU] via INTRAVENOUS
  Filled 2018-09-02: qty 4000

## 2018-09-02 MED ORDER — ACETAMINOPHEN 325 MG PO TABS: 650.0000 mg | ORAL_TABLET | ORAL | Status: DC | PRN

## 2018-09-02 MED ORDER — VERAPAMIL HCL 2.5 MG/ML IV SOLN
INTRAVENOUS | Status: DC | PRN
  Administered 2018-09-02: 10 mL via INTRA_ARTERIAL

## 2018-09-02 MED ORDER — HEPARIN (PORCINE) IN NACL 1000-0.9 UT/500ML-% IV SOLN
INTRAVENOUS | Status: AC
  Filled 2018-09-02: qty 1000

## 2018-09-02 MED ORDER — SODIUM CHLORIDE 0.9 % WEIGHT BASED INFUSION: 0.9000 mL/kg/h | INTRAVENOUS | Status: DC

## 2018-09-02 MED ORDER — VITAMIN C 250 MG PO TABS: 250.0000 mg | ORAL_TABLET | Freq: Every day | ORAL | Status: DC

## 2018-09-02 MED ORDER — PANTOPRAZOLE SODIUM 40 MG PO TBEC: 40.0000 mg | DELAYED_RELEASE_TABLET | Freq: Every day | ORAL | Status: DC

## 2018-09-02 MED ORDER — VERAPAMIL HCL 2.5 MG/ML IV SOLN
INTRAVENOUS | Status: AC
  Filled 2018-09-02: qty 2

## 2018-09-02 MED ORDER — PREGABALIN 100 MG PO CAPS
300.0000 mg | ORAL_CAPSULE | Freq: Two times a day (BID) | ORAL | Status: DC
  Administered 2018-09-02: 300 mg via ORAL
  Filled 2018-09-02: qty 3

## 2018-09-02 MED ORDER — LOSARTAN POTASSIUM 50 MG PO TABS: 25.0000 mg | ORAL_TABLET | Freq: Every day | ORAL | Status: DC

## 2018-09-02 MED ORDER — SODIUM CHLORIDE 0.9 % WEIGHT BASED INFUSION: 3.0000 mL/kg/h | INTRAVENOUS | Status: DC

## 2018-09-02 MED ORDER — CLOPIDOGREL BISULFATE 75 MG PO TABS
75.0000 mg | ORAL_TABLET | Freq: Every day | ORAL | Status: DC
  Filled 2018-09-02: qty 1

## 2018-09-02 MED ORDER — IOPAMIDOL (ISOVUE-370) INJECTION 76%
INTRAVENOUS | Status: AC
  Filled 2018-09-02: qty 100

## 2018-09-02 MED ORDER — CYCLOBENZAPRINE HCL 10 MG PO TABS: 10.0000 mg | ORAL_TABLET | Freq: Three times a day (TID) | ORAL | Status: DC | PRN

## 2018-09-02 MED ORDER — VENLAFAXINE HCL ER 75 MG PO CP24
150.0000 mg | ORAL_CAPSULE | Freq: Two times a day (BID) | ORAL | Status: DC
  Filled 2018-09-02 (×2): qty 2

## 2018-09-02 MED ORDER — EPINEPHRINE PF 1 MG/ML IJ SOLN
0.5000 ug/min | INTRAVENOUS | Status: DC
  Filled 2018-09-02: qty 4

## 2018-09-02 MED ORDER — IOPAMIDOL (ISOVUE-370) INJECTION 76%
100.0000 mL | Freq: Once | INTRAVENOUS | Status: AC | PRN
  Administered 2018-09-02: 100 mL via INTRAVENOUS

## 2018-09-02 MED ORDER — IOHEXOL 350 MG/ML SOLN
INTRAVENOUS | Status: DC | PRN
  Administered 2018-09-02: 75 mL via INTRAVENOUS

## 2018-09-02 MED ORDER — VITAMIN B-12 100 MCG PO TABS: 100.0000 ug | ORAL_TABLET | Freq: Every day | ORAL | Status: DC

## 2018-09-02 MED ORDER — BENZONATATE 100 MG PO CAPS: 200.0000 mg | ORAL_CAPSULE | Freq: Two times a day (BID) | ORAL | Status: DC | PRN

## 2018-09-02 MED ORDER — HEPARIN SODIUM (PORCINE) 1000 UNIT/ML IJ SOLN
INTRAMUSCULAR | Status: DC | PRN
  Administered 2018-09-02: 4000 [IU] via INTRAVENOUS

## 2018-09-02 MED ORDER — HEPARIN (PORCINE) IN NACL 1000-0.9 UT/500ML-% IV SOLN
INTRAVENOUS | Status: DC | PRN
  Administered 2018-09-02 (×2): 500 mL

## 2018-09-02 MED ORDER — LIDOCAINE HCL (PF) 1 % IJ SOLN
INTRAMUSCULAR | Status: AC
  Filled 2018-09-02: qty 30

## 2018-09-02 MED ORDER — MIDAZOLAM HCL 2 MG/2ML IJ SOLN
INTRAMUSCULAR | Status: DC | PRN
  Administered 2018-09-02 (×2): 0.5 mg via INTRAVENOUS

## 2018-09-02 MED ORDER — HEPARIN SODIUM (PORCINE) 5000 UNIT/ML IJ SOLN: 5000.0000 [IU] | Freq: Three times a day (TID) | INTRAMUSCULAR | Status: DC

## 2018-09-02 MED ORDER — SODIUM CHLORIDE 0.9% FLUSH: 3.0000 mL | INTRAVENOUS | Status: DC | PRN

## 2018-09-02 MED ORDER — OXYCODONE HCL 5 MG PO TABS: 5.0000 mg | ORAL_TABLET | ORAL | Status: DC | PRN

## 2018-09-02 MED ORDER — HYDROCODONE-ACETAMINOPHEN 5-325 MG PO TABS: 1.0000 | ORAL_TABLET | Freq: Four times a day (QID) | ORAL | Status: DC | PRN

## 2018-09-02 MED ORDER — ASPIRIN 81 MG PO CHEW
324.0000 mg | CHEWABLE_TABLET | Freq: Once | ORAL | Status: AC
  Administered 2018-09-02: 324 mg via ORAL
  Filled 2018-09-02: qty 4

## 2018-09-02 MED ORDER — NITROGLYCERIN 0.4 MG SL SUBL: 0.4000 mg | SUBLINGUAL_TABLET | SUBLINGUAL | Status: DC | PRN

## 2018-09-02 MED ORDER — SODIUM CHLORIDE (PF) 0.9 % IJ SOLN
INTRAMUSCULAR | Status: AC
  Filled 2018-09-02: qty 50

## 2018-09-02 MED ORDER — HEPARIN (PORCINE) 25000 UT/250ML-% IV SOLN: 14.0000 [IU]/kg/h | INTRAVENOUS | Status: DC

## 2018-09-02 MED ORDER — ALPRAZOLAM 0.25 MG PO TABS: 0.2500 mg | ORAL_TABLET | Freq: Two times a day (BID) | ORAL | Status: DC | PRN

## 2018-09-02 MED ORDER — ATORVASTATIN CALCIUM 80 MG PO TABS: 80.0000 mg | ORAL_TABLET | Freq: Every day | ORAL | Status: DC

## 2018-09-02 MED ORDER — MIDAZOLAM HCL 2 MG/2ML IJ SOLN
INTRAMUSCULAR | Status: AC
  Filled 2018-09-02: qty 2

## 2018-09-02 MED ORDER — SODIUM CHLORIDE 0.9 % WEIGHT BASED INFUSION: 1.0000 mL/kg/h | INTRAVENOUS | Status: DC

## 2018-09-02 MED ORDER — ASPIRIN EC 81 MG PO TBEC: 81.0000 mg | DELAYED_RELEASE_TABLET | Freq: Every day | ORAL | Status: DC

## 2018-09-02 SURGICAL SUPPLY — 10 items
CATH 5FR JL3.5 JR4 ANG PIG MP (CATHETERS) ×1 IMPLANT
DEVICE RAD COMP TR BAND LRG (VASCULAR PRODUCTS) ×1 IMPLANT
GLIDESHEATH SLEND A-KIT 6F 22G (SHEATH) ×1 IMPLANT
GUIDEWIRE INQWIRE 1.5J.035X260 (WIRE) IMPLANT
INQWIRE 1.5J .035X260CM (WIRE) ×2
KIT HEART LEFT (KITS) ×2 IMPLANT
PACK CARDIAC CATHETERIZATION (CUSTOM PROCEDURE TRAY) ×2 IMPLANT
SHEATH PROBE COVER 6X72 (BAG) ×1 IMPLANT
TRANSDUCER W/STOPCOCK (MISCELLANEOUS) ×2 IMPLANT
TUBING CIL FLEX 10 FLL-RA (TUBING) ×2 IMPLANT

## 2018-09-03 ENCOUNTER — Encounter (HOSPITAL_COMMUNITY): Payer: Self-pay | Admitting: Interventional Cardiology

## 2018-09-03 MED FILL — Medication: Qty: 1 | Status: AC

## 2018-09-18 NOTE — ED Notes (Signed)
RN and MD notified of abnormal lab

## 2018-09-18 NOTE — H&P (Signed)
Cardiology Admission History and Physical:   Patient ID: Bonnie Lawson; MRN: [id number]; DOB: [birth year]   Admission date: 2018-09-11  Primary Care Provider: Forrest Moron, MD Primary Cardiologist: No primary care provider on file. New Primary Electrophysiologist:  None  Chief Complaint:  Elevated Troponin  Patient Profile:   Bonnie Lawson is a 72 y.o. female with a history of HTN, thyroid dz, GERD, allergies, corneal ulcer, who came to the ER for abdominal pain, hx PUD w/ perf, pain between her shoulder blades. Troponin elevated, cards asked to see.   History of Present Illness:   Bonnie Lawson went to her PCP 01/07 for URI, flu-like sx that had started a week before. CXR w/out PNA, was started on steroids and a Zpack. Those sx improved.  She had onset of a feeling of a "load of bricks" on her chest 3 days ago.   She called her PCP 01/14, who told her to go to the ER if sx recurred.  She went to [facility] 01/15 and was given Carafate and Pepcid for gastritis.  Came to the ER today for abdominal/epigastric pain and nausea. Troponin is 8. Pain is in her abdomen and epigastric area. 7-8/10 at its worst, no SOB w/ it, nauseated but no vomiting, no diaphoresis.   She had never had the pain before. Is pain-free now.    Past Medical History:  Diagnosis Date  . Allergy   . Chronic pain   . Gastric ulcer 2006   Required surgery  . HTN (hypertension)   . Tobacco use   . Upper airway cough syndrome     Past Surgical History:  Procedure Laterality Date  . ABDOMINAL HYSTERECTOMY     per patient age 58's  . ANKLE FRACTURE SURGERY Left   . BACK SURGERY  2008  . NASAL SEPTUM SURGERY     per patient late 20's  . REPAIR OF PERFORATED ULCER  2006  . TONSILLECTOMY     childhood  . TUBAL LIGATION     per patient age 89's     Medications Prior to Admission: Prior to Admission medications   Medication Sig Start Date End Date Taking? Authorizing Provider  Ascorbic Acid (VITAMIN C PO)  Take 1 tablet by mouth daily.   Yes [provider]  aspirin EC 81 MG tablet Take 81 mg by mouth daily.   Yes [provider]  benzonatate (TESSALON) 200 MG capsule Take 1 capsule (200 mg total) by mouth 2 (two) times daily as needed for cough. 08/24/18  Yes Sagardia, Ines Bloomer, MD  celecoxib (CELEBREX) 200 MG capsule Take 200 mg by mouth 2 (two) times daily.   Yes [provider]  Cyanocobalamin (VITAMIN B-12 PO) Take 1 tablet by mouth daily.   Yes [provider]  cyclobenzaprine (FLEXERIL) 10 MG tablet Take 1 tablet (10 mg total) by mouth 3 (three) times daily as needed for muscle spasms. 08/02/18  Yes Stallings, Zoe A, MD  fluticasone (FLONASE) 50 MCG/ACT nasal spray Place 2 sprays into both nostrils daily. Patient taking differently: Place 2 sprays into both nostrils daily as needed for allergies.  08/02/18  Yes Forrest Moron, MD  HYDROcodone-acetaminophen (NORCO/VICODIN) 5-325 MG tablet Take 1 tablet by mouth every 6 (six) hours as needed for moderate pain. 08/02/18  Yes Stallings, Zoe A, MD  levothyroxine (SYNTHROID, LEVOTHROID) 88 MCG tablet TAKE 1 TABLET BY MOUTH DAILY BEFORE BREAKFAST WITH NO OTHER MEDS Patient taking differently: Take 88 mcg by mouth daily before breakfast.  08/05/18  Yes Stallings, Zoe A, MD  losartan-hydrochlorothiazide (HYZAAR) 100-25 MG tablet Take 1 tablet by mouth daily. 08/02/18  Yes Forrest Moron, MD  omeprazole (PRILOSEC) 40 MG capsule Take 1 capsule (40 mg total) by mouth daily. 08/02/18  Yes Delia Chimes A, MD  pregabalin (LYRICA) 300 MG capsule Take 1 capsule (300 mg total) by mouth 2 (two) times daily. 08/02/18  Yes Forrest Moron, MD  venlafaxine XR (EFFEXOR-XR) 150 MG 24 hr capsule TAKE 1 CAPSULE(150 MG) BY MOUTH TWICE DAILY WITH A MEAL Patient taking differently: Take 150 mg by mouth 2 (two) times daily with a meal.  08/02/18  Yes Stallings, Zoe A, MD  VITAMIN D PO Take 1 tablet by mouth daily.   Yes [provider]  VITAMIN E PO Take 1 tablet by mouth daily.   Yes [provider]  azithromycin (ZITHROMAX) 250 MG tablet Sig as indicated Patient not taking: Reported on 10/01/18 08/24/18   Horald Pollen, MD     Allergies:    Allergies  Allergen Reactions  . Sulfa Antibiotics Other (See Comments)    Unknown rxn    Social History:   Social History   Socioeconomic History  . Marital status: Divorced    Spouse name: Not on file  . Number of children: 2  . Years of education: Not on file  . Highest education level: Not on file  Occupational History  . Not on file  Social Needs  . Financial resource strain: Not on file  . Food insecurity:    Worry: Not on file    Inability: Not on file  . Transportation needs:    Medical: Not on file    Non-medical: Not on file  Tobacco Use  . Smoking status: Current Every Day Smoker    Packs/day: 0.75    Years: 37.00    Pack years: 27.75    Types: Cigarettes  . Smokeless tobacco: Never Used  Substance and Sexual Activity  . Alcohol use: Yes    Alcohol/week: 2.0 standard drinks    Types: 1 Glasses of wine, 1 Cans of beer per week    Comment: occ  . Drug use: No  . Sexual activity: Not Currently  Lifestyle  . Physical activity:    Days per week: Not on file    Minutes per session: Not on file  . Stress: Not on file  Relationships  . Social connections:    Talks on phone: Not on file    Gets together: Not on file    Attends religious service: Not on file    Active member of club or organization: Not on file    Attends meetings of clubs or organizations: Not on file    Relationship status: Not on file  . Intimate partner violence:    Fear of current or ex partner: Not on file    Emotionally abused: Not on file    Physically abused: Not on file    Forced sexual activity: Not on file  Other Topics Concern  . Not on file  Social History Narrative  . Not on file    Family History:   The patient's family  history includes Heart attack in her father and mother. There is no history of Stroke.   The patient She indicated that her mother is deceased. She indicated that her father is deceased. She indicated that the status of her neg hx is unknown.    ROS:  Please see the  history of present illness.  All other ROS reviewed and negative.     Physical Exam/Data:   Vitals:   13-Sep-2018 1200 09-13-2018 1215 2018-09-13 1230 2018-09-13 1242  BP: 109/81  100/68 100/68  Pulse:    77  Resp: 16 18 17 19   Temp:      TempSrc:      SpO2:    93%   No intake or output data in the 24 hours ending 2018/09/13 1334 There were no vitals filed for this visit. There is no height or weight on file to calculate BMI.  General:  Well nourished, well developed, in no acute distress, mildly obese HEENT: normal Lymph: no adenopathy Neck:  JVD  not elevated Endocrine:  No thryomegaly Vascular: No carotid bruits; FA pulses 2+ bilaterally without bruits  Cardiac:  normal S1, S2; RRR; no murmur, no rub or gallop  Lungs:  clear to auscultation bilaterally, no wheezing, rhonchi or rales  Abd: soft, nontender, no hepatomegaly  Ext: no edema Musculoskeletal:  No deformities, BUE and BLE strength normal and equal Skin: warm and dry  Neuro:  CNs 2-12 intact, no focal abnormalities noted Psych:  Normal affect    EKG:  The ECG that was done was personally reviewed and demonstrates NSR, relatively early R/S transition V2, consider posterior infarction  Relevant CV Studies:  ECHO:   CATH:   Laboratory Data:  Chemistry Recent Labs  Lab 2018-09-13 0940  NA 133*  K 3.2*  CL 95*  CO2 25  GLUCOSE 192*  BUN 21  CREATININE 1.00  CALCIUM 8.5*  GFRNONAA 56*  GFRAA >60  ANIONGAP 13    Recent Labs  Lab 09-13-18 0940  PROT 7.7  ALBUMIN 3.7  AST 68*  ALT 22  ALKPHOS 94  BILITOT 0.5   Hematology Recent Labs  Lab September 13, 2018 0940  WBC 16.8*  RBC 4.64  HGB 13.7  HCT 41.8  MCV 90.1  MCH 29.5  MCHC 32.8  RDW  15.9*  PLT 290   Cardiac EnzymesNo results for input(s): TROPONINI in the last 168 hours.  Recent Labs  Lab 09-13-18 0958 Sep 13, 2018 1304  TROPIPOC 8.49* 8.72*    BNPNo results for input(s): BNP, PROBNP in the last 168 hours.  DDimer No results for input(s): DDIMER in the last 168 hours.  Radiology/Studies:  Dg Chest 2 View  Result Date: 09-13-2018 CLINICAL DATA:  Shortness of breath and epigastric pain. Cough and congestion. EXAM: CHEST - 2 VIEW COMPARISON:  08/24/2018 FINDINGS: Heart size is normal. Aortic atherosclerosis. Bronchitis pattern without consolidation or lobar collapse. Possible mild atelectasis in the lingula. No effusion. No significant bone finding. IMPRESSION: 1. Bronchitis pattern. Possible mild atelectasis in the lingula. No consolidation or lobar collapse. 2. Aortic atherosclerosis. Electronically Signed   By: Nelson Chimes M.D.   On: 09-13-2018 10:31   Ct Angio Chest/abd/pel For Dissection W And/or W/wo  Result Date: 2018-09-13 CLINICAL DATA:  Chest and abdominal pain EXAM: CT ANGIOGRAPHY CHEST, ABDOMEN AND PELVIS TECHNIQUE: Multidetector CT imaging through the chest, abdomen and pelvis was performed using the standard protocol during bolus administration of intravenous contrast. Multiplanar reconstructed images and MIPs were obtained and reviewed to evaluate the vascular anatomy. CONTRAST:  100 mL Isovue 370. COMPARISON:  04/23/2016 CT of the abdomen and pelvis FINDINGS: CTA CHEST FINDINGS Cardiovascular: Atherosclerotic calcifications of the thoracic aorta are noted. Mild noncalcified atherosclerotic plaque is noted in the descending thoracic aorta. No aneurysmal dilatation is seen. No evidence of dissection is seen. No cardiac  enlargement is noted. Mild coronary calcifications are identified. The pulmonary artery shows a normal branching pattern without evidence of pulmonary emboli. Mediastinum/Nodes: Thoracic inlet is within normal limits. No hilar or mediastinal adenopathy  is noted. The esophagus is within normal limits. Lungs/Pleura: Lungs are well aerated bilaterally. Minimal left basilar atelectasis is seen in the lower lobe. Musculoskeletal: Degenerative changes of the thoracic spine are seen. Review of the MIP images confirms the above findings. CTA ABDOMEN AND PELVIS FINDINGS VASCULAR Aorta: The abdominal aorta demonstrates some mildly ulcerated atherosclerotic plaque without aneurysmal dilatation or focal dissection. Celiac: Patent without evidence of aneurysm, dissection, vasculitis or significant stenosis. SMA: Patent without evidence of aneurysm, dissection, vasculitis or significant stenosis. Renals: Dual renal arteries are identified bilaterally. Mild atherosclerotic changes are seen. No focal hemodynamically significant stenosis is seen. IMA: Patent without evidence of aneurysm, dissection, vasculitis or significant stenosis. Iliacs: Atherosclerotic changes without acute abnormality. Veins: No vein abnormality is noted. Review of the MIP images confirms the above findings. NON-VASCULAR Hepatobiliary: Gallbladder is well distended. Fatty infiltration of the liver is seen. Pancreas: Unremarkable. No pancreatic ductal dilatation or surrounding inflammatory changes. Spleen: Normal in size without focal abnormality. Adrenals/Urinary Tract: Adrenal glands are within normal limits bilaterally. No renal calculi or urinary tract obstructive changes are seen. No ureteral stones are noted. The bladder is decompressed. Stomach/Bowel: Scattered diverticular change of the colon is noted without evidence of diverticulitis. The appendix is within normal limits. No small bowel abnormality is seen. Stomach is unremarkable. Lymphatic: No significant lymphadenopathy is noted. Reproductive: Status post hysterectomy. No adnexal masses. Other: No abdominal wall hernia or abnormality. No abdominopelvic ascites. Musculoskeletal: Degenerative changes of lumbar spine are seen. No compression  deformity is noted. Review of the MIP images confirms the above findings. IMPRESSION: Atherosclerotic plaque is noted within the thoracic and abdominal aorta without evidence of aneurysmal dilatation or focal dissection. No evidence of pulmonary emboli. Minimal left basilar atelectasis. Diverticulosis without diverticulitis.  No acute abnormality noted. Electronically Signed   By: Inez Catalina M.D.   On: 12-Sep-2018 11:50    Assessment and Plan:   Principal Problem:   NSTEMI (non-ST elevated myocardial infarction) [facility]) Active Problems:   Essential hypertension   Cigarette smoker  1. NSTEMI:  Plan cardiac catheterization today due to ongoing symptoms. The diagnostic procedure and possible PCI-stent has been fully reviewed with the patient and written informed consent has been obtained. Start statin. On ASA and IV heparin. 2. HTN:  Normal BP. Prefer beta blockers and ARBs for control. 3. Smoking: strongly recommended smoking cessation.    Severity of Illness: The appropriate patient status for this patient is INPATIENT. Inpatient status is judged to be reasonable and necessary in order to provide the required intensity of service to ensure the patient's safety. The patient's presenting symptoms, physical exam findings, and initial radiographic and laboratory data in the context of their chronic comorbidities is felt to place them at high risk for further clinical deterioration. Furthermore, it is not anticipated that the patient will be medically stable for discharge from the hospital within 2 midnights of admission. The following factors support the patient status of inpatient.   " The patient's presenting symptoms include chest pain. " The worrisome physical exam findings include S4 gallop. " The initial radiographic and laboratory data are worrisome because of elevated cardiac enzymes consistent with acute MI. " The chronic co-morbidities include smoking, obesity.   * I certify that at the  point of admission it is my clinical judgment  that the patient will require inpatient hospital care spanning beyond 2 midnights from the point of admission due to high intensity of service, high risk for further deterioration and high frequency of surveillance required.*    For questions or updates, please contact [place] Please consult [email or web address] for contact info under Cardiology/STEMI.    Signed, Rosaria Ferries, PA-C  Sep 09, 2018 1:34 PM   I have seen and examined the patient along with Rosaria Ferries, PA-C .  I have reviewed the chart, notes and new data.  I agree with PA/NP's note.  Key new complaints: suspect she had NSTEMI one week ago, but she still has waxing and waning symptoms. May still have viable myocardium in jeopardy (versus post MI pericarditis). Could benefit from revascularization if anatomy is apprpriate. Key examination changes: no evident CHF or arrhythmia, no rub, soft S4 Key new findings / data: Relatively silent ECG and R wave in V2 suggest left circumflex as culprit artery. Troponin 8 is likely "tail-end" of more significant elevation that peaked 4-5 days ago.  PLAN: Discussed indications for cath/PCI, risks/benefits/possible complications and further therapy in depth with the patient and her family. Will plan on cardiac catheterization today.  Sanda Klein, MD, [place] [phone] 09-09-2018, 1:56 PM

## 2018-09-18 NOTE — Code Documentation (Signed)
CODE BLUE NOTE  Patient Name: Bonnie Lawson   MRN: [id number]   Date of Birth/ Sex: Jul 31, 1946 , female      Admission Date: 09-23-2018  Attending Provider: Belva Crome, MD  Primary Diagnosis: Non-STEMI (non-ST elevated myocardial infarction) [facility]) [I21.4] NSTEMI (non-ST elevated myocardial infarction) [facility]) [I21.4]    Indication: Pt was in her usual state of health until this PM, when she was noted to be breathing agonally in sinus bradycardia. Code blue was subsequently called. At the time of arrival on scene, ACLS protocol was underway.   Technical Description:  - CPR performance duration:  30 minutes  - Was defibrillation or cardioversion used? Yes   - Was external pacer placed? No  - Was patient intubated pre/post CPR? Yes    Medications Administered: Y = Yes; Blank = No Amiodarone    Atropine  Y  Calcium  Y  Epinephrine  Y  Lidocaine    Magnesium    Norepinephrine  Y  Phenylephrine    Sodium bicarbonate  Y  Vasopressin    Other Y  Dopamine   Post CPR evaluation:  - Final Status - Was patient successfully resuscitated ? No   Miscellaneous Information:  - Time of death:  8:56 PM  - Primary team notified?  Yes  - Family Notified? Yes        Cleophas Dunker, DO   2018-09-23, 9:06 PM

## 2018-09-18 NOTE — Telephone Encounter (Signed)
FYI to provider.  Thanks, [employer]

## 2018-09-18 NOTE — Progress Notes (Signed)
[place] for heparin Indication: chest pain/ACS  Allergies  Allergen Reactions  . Sulfa Antibiotics Other (See Comments)    Unknown rxn    Patient Measurements:   Heparin Dosing Weight: 71.6 kg (08/24/18)  Vital Signs: Temp: 98.1 F (36.7 C) (01/16 0910) Temp Source: Oral (01/16 0910) BP: 100/68 (01/16 1242) Pulse Rate: 77 (01/16 1242)  Labs: Recent Labs    Sep 20, 2018 0940  HGB 13.7  HCT 41.8  PLT 290  APTT 31  LABPROT 12.0  INR 0.90  CREATININE 1.00    Estimated Creatinine Clearance: 52.4 mL/min (by C-G formula based on SCr of 1 mg/dL).   Medical History: Past Medical History:  Diagnosis Date  . Allergy   . Chronic pain   . HTN (hypertension)   . Tobacco use   . Upper airway cough syndrome     Medications:  No anticoagulants PTA  Assessment: 73 y/o F presented with abdominal pain, nonspecific chest pain since Friday. Patient has a h/o perforated peptic ulcer in the past. Troponin elevated at 8.49, no EKG changes. Heparin drip ordered.   Goal of Therapy:  Heparin level 0.3-0.7 units/ml Monitor platelets by anticoagulation protocol: Yes   Plan:  Give 4000 units bolus x 1 Start heparin infusion at 850 units/hr Check anti-Xa level in 8 hours and daily while on heparin Continue to monitor H&H and platelets  Ulice Dash D 09-20-2018,1:01 PM

## 2018-09-18 NOTE — ED Notes (Signed)
[facility]link have been called

## 2018-09-18 NOTE — CV Procedure (Signed)
   Coronary angiography and left heart cath performed via right radial approach.  Real-time vascular ultrasound used for access.  Total occlusion of the second obtuse marginal branch without collaterals.  This is a culprit for the patient's small infarction.  The remainder of the circumflex is widely patent.  Left main is normal.  LAD is large and contains luminal irregularities but no high-grade obstruction  Right coronary is dominant and contains no significant obstructive disease.  Apical hypokinesis related to infarct.  EF 55%.  LVEDP normal at 13 mmHg.  No complications.

## 2018-09-18 NOTE — ED Notes (Signed)
Patient transported to CT 

## 2018-09-18 NOTE — Code Documentation (Signed)
Name: Bonnie Lawson  Location: [id number]  Petra Kuba of visit: Code Blue  Summary:  Chaplain answered a blue code page. Daughter at beside. Code unsuccessful. Family was notified and medical staff sat with family to answer questions. Most of the family arrived after time of death. Chaplain provided emotional and spiritual support via prayer.

## 2018-09-18 NOTE — ED Triage Notes (Signed)
Per pt, states she has been treated for bronchitis since 1/1-states abdominal pain and nausea since last Friday-saw PCP yesterday and states her MD thought her ulcer might be coming back-states her WBC was 19.0 and was told to come to ED

## 2018-09-18 NOTE — ED Provider Notes (Addendum)
[facility] DEPT Provider Note   CSN: [id number] Arrival date & time: 2018/10/02  0855     History   Chief Complaint Chief Complaint  Patient presents with  . Abdominal Pain    HPI Bonnie Lawson is a 73 y.o. female.  Presenting with complaint of abdominal pain, history of peptic ulcer.  Patient talks about having fairly significant epigastric lower part of chest pain on Friday.  Did have some discomfort between the shoulder blades as well.  Pain has persisted since then but does not need any pain medicine for the pain today.  No history of GI bleed.  But patient has had significant complications from a perforated peptic ulcer in the past.  Does have a known ventral hernia also had several follow-up operations following that.  Patient was seen yesterday a white count was noted to be 19,000 referred in for follow-up.  Associated with nausea but no vomiting no diarrhea.  The complications from the perforated ulcer included multiple abscesses.  Patient in the past has had a history of chronic pain syndrome.     Past Medical History:  Diagnosis Date  . Allergy     Patient Active Problem List   Diagnosis Date Noted  . Chronic left-sided low back pain with left-sided sciatica 05/25/2017  . Upper airway cough syndrome 05/18/2017  . Nocturnal hypoxemia 11/06/2016  . Moderate depressive episode ([place]) 11/06/2016  . Menopause 11/06/2016  . Cigarette smoker 11/06/2016  . Uncontrolled hypertension 11/05/2016  . Abdominal pain, chronic, epigastric 04/25/2016  . Abdominal bloating 04/25/2016  . Leukocytosis 04/25/2016  . Insomnia 04/25/2016  . Essential hypertension 04/25/2016  . Chronic pain 04/25/2016    Past Surgical History:  Procedure Laterality Date  . ABDOMINAL HYSTERECTOMY     per patient age 19's  . ANKLE FRACTURE SURGERY Left   . BACK SURGERY  2008  . NASAL SEPTUM SURGERY     per patient late 20's  . NO PAST SURGERIES    . REPAIR OF  PERFORATED ULCER  2006  . TONSILLECTOMY     childhood  . TUBAL LIGATION     per patient age 75's     OB History   No obstetric history on file.      Home Medications    Prior to Admission medications   Medication Sig Start Date End Date Taking? Authorizing Provider  aspirin EC 81 MG tablet Take 81 mg by mouth daily.    [provider]  azithromycin (ZITHROMAX) 250 MG tablet Sig as indicated 08/24/18   Horald Pollen, MD  benzonatate (TESSALON) 200 MG capsule Take 1 capsule (200 mg total) by mouth 2 (two) times daily as needed for cough. 08/24/18   Horald Pollen, MD  celecoxib (CELEBREX) 200 MG capsule Take 200 mg by mouth 2 (two) times daily.    [provider]  cyclobenzaprine (FLEXERIL) 10 MG tablet Take 1 tablet (10 mg total) by mouth 3 (three) times daily as needed for muscle spasms. 08/02/18   Forrest Moron, MD  fluticasone (FLONASE) 50 MCG/ACT nasal spray Place 2 sprays into both nostrils daily. 08/02/18   Forrest Moron, MD  HYDROcodone-acetaminophen (NORCO/VICODIN) 5-325 MG tablet Take 1 tablet by mouth every 6 (six) hours as needed for moderate pain. 08/02/18   Forrest Moron, MD  levothyroxine (SYNTHROID, LEVOTHROID) 88 MCG tablet TAKE 1 TABLET BY MOUTH DAILY BEFORE BREAKFAST WITH NO OTHER MEDS 08/05/18   Forrest Moron, MD  losartan-hydrochlorothiazide [facility]) 100-25  MG tablet Take 1 tablet by mouth daily. 08/02/18   Forrest Moron, MD  omeprazole (PRILOSEC) 40 MG capsule Take 1 capsule (40 mg total) by mouth daily. 08/02/18   Forrest Moron, MD  pregabalin (LYRICA) 300 MG capsule Take 1 capsule (300 mg total) by mouth 2 (two) times daily. 08/02/18   Forrest Moron, MD  venlafaxine XR (EFFEXOR-XR) 150 MG 24 hr capsule TAKE 1 CAPSULE(150 MG) BY MOUTH TWICE DAILY WITH A MEAL 08/02/18   Forrest Moron, MD    Family History Family History  Problem Relation Age of Onset  . Stroke Neg Hx     Social History Social History    Tobacco Use  . Smoking status: Current Every Day Smoker    Packs/day: 0.75    Years: 37.00    Pack years: 27.75    Types: Cigarettes  . Smokeless tobacco: Never Used  Substance Use Topics  . Alcohol use: Yes    Alcohol/week: 2.0 standard drinks    Types: 1 Glasses of wine, 1 Cans of beer per week    Comment: occ  . Drug use: No     Allergies   Sulfa antibiotics   Review of Systems Review of Systems  Constitutional: Negative for chills and fever.  HENT: Negative for congestion, rhinorrhea and sore throat.   Eyes: Negative for visual disturbance.  Respiratory: Negative for cough and shortness of breath.   Cardiovascular: Positive for chest pain. Negative for leg swelling.  Gastrointestinal: Positive for abdominal pain and nausea. Negative for diarrhea and vomiting.  Genitourinary: Negative for dysuria.  Musculoskeletal: Positive for back pain. Negative for neck pain.  Skin: Negative for rash.  Neurological: Negative for dizziness, light-headedness and headaches.  Hematological: Does not bruise/bleed easily.  Psychiatric/Behavioral: Negative for confusion.     Physical Exam Updated Vital Signs BP 126/66 (BP Location: Right Arm)   Pulse 86   Temp 98.1 F (36.7 C) (Oral)   Resp 18   SpO2 94%   Physical Exam Vitals signs and nursing note reviewed.  Constitutional:      General: She is not in acute distress.    Appearance: She is well-developed.  HENT:     Head: Normocephalic and atraumatic.     Mouth/Throat:     Mouth: Mucous membranes are moist.  Eyes:     Conjunctiva/sclera: Conjunctivae normal.  Neck:     Musculoskeletal: Neck supple.  Cardiovascular:     Rate and Rhythm: Normal rate and regular rhythm.     Heart sounds: No murmur.  Pulmonary:     Effort: Pulmonary effort is normal. No respiratory distress.     Breath sounds: Normal breath sounds.  Chest:     Chest wall: No tenderness.  Abdominal:     General: Abdomen is flat. Bowel sounds are  normal.     Palpations: Abdomen is soft.     Tenderness: There is abdominal tenderness.     Comments: Mild tenderness to palpation epigastric area no obvious mass.  No guarding.  Musculoskeletal: Normal range of motion.  Skin:    General: Skin is warm and dry.  Neurological:     General: No focal deficit present.     Mental Status: She is alert and oriented to person, place, and time.      ED Treatments / Results  Labs (all labs ordered are listed, but only abnormal results are displayed) Labs Reviewed  COMPREHENSIVE METABOLIC PANEL - Abnormal; Notable for the following components:  Result Value   Sodium 133 (*)    Potassium 3.2 (*)    Chloride 95 (*)    Glucose, Bld 192 (*)    Calcium 8.5 (*)    AST 68 (*)    GFR calc non Af Amer 56 (*)    All other components within normal limits  CBC WITH DIFFERENTIAL/PLATELET - Abnormal; Notable for the following components:   WBC 16.8 (*)    RDW 15.9 (*)    Neutro Abs 13.3 (*)    Abs Immature Granulocytes 0.18 (*)    All other components within normal limits  I-STAT TROPONIN, ED - Abnormal; Notable for the following components:   Troponin i, poc 8.49 (*)    All other components within normal limits  LIPASE, BLOOD    EKG EKG Interpretation  Date/Time:  09-24-2018 10:11:24 EST Ventricular Rate:  95 PR Interval:    QRS Duration: 101 QT Interval:  375 QTC Calculation: 472 R Axis:   -3 Text Interpretation:  Sinus rhythm Interpretation limited secondary to artifact No significant change since last tracing Confirmed by Fredia Sorrow [phone]) on 09/24/2018 10:14:20 AM Also confirmed by Fredia Sorrow [phone]), editor Philomena Doheny [phone])  on 24-Sep-2018 10:25:55 AM   Radiology Dg Chest 2 View  Result Date: 2018/09/24 CLINICAL DATA:  Shortness of breath and epigastric pain. Cough and congestion. EXAM: CHEST - 2 VIEW COMPARISON:  08/24/2018 FINDINGS: Heart size is normal. Aortic atherosclerosis. Bronchitis pattern  without consolidation or lobar collapse. Possible mild atelectasis in the lingula. No effusion. No significant bone finding. IMPRESSION: 1. Bronchitis pattern. Possible mild atelectasis in the lingula. No consolidation or lobar collapse. 2. Aortic atherosclerosis. Electronically Signed   By: Nelson Chimes M.D.   On: 24-Sep-2018 10:31   Ct Angio Chest/abd/pel For Dissection W And/or W/wo  Result Date: 2018-09-24 CLINICAL DATA:  Chest and abdominal pain EXAM: CT ANGIOGRAPHY CHEST, ABDOMEN AND PELVIS TECHNIQUE: Multidetector CT imaging through the chest, abdomen and pelvis was performed using the standard protocol during bolus administration of intravenous contrast. Multiplanar reconstructed images and MIPs were obtained and reviewed to evaluate the vascular anatomy. CONTRAST:  100 mL Isovue 370. COMPARISON:  04/23/2016 CT of the abdomen and pelvis FINDINGS: CTA CHEST FINDINGS Cardiovascular: Atherosclerotic calcifications of the thoracic aorta are noted. Mild noncalcified atherosclerotic plaque is noted in the descending thoracic aorta. No aneurysmal dilatation is seen. No evidence of dissection is seen. No cardiac enlargement is noted. Mild coronary calcifications are identified. The pulmonary artery shows a normal branching pattern without evidence of pulmonary emboli. Mediastinum/Nodes: Thoracic inlet is within normal limits. No hilar or mediastinal adenopathy is noted. The esophagus is within normal limits. Lungs/Pleura: Lungs are well aerated bilaterally. Minimal left basilar atelectasis is seen in the lower lobe. Musculoskeletal: Degenerative changes of the thoracic spine are seen. Review of the MIP images confirms the above findings. CTA ABDOMEN AND PELVIS FINDINGS VASCULAR Aorta: The abdominal aorta demonstrates some mildly ulcerated atherosclerotic plaque without aneurysmal dilatation or focal dissection. Celiac: Patent without evidence of aneurysm, dissection, vasculitis or significant stenosis. SMA:  Patent without evidence of aneurysm, dissection, vasculitis or significant stenosis. Renals: Dual renal arteries are identified bilaterally. Mild atherosclerotic changes are seen. No focal hemodynamically significant stenosis is seen. IMA: Patent without evidence of aneurysm, dissection, vasculitis or significant stenosis. Iliacs: Atherosclerotic changes without acute abnormality. Veins: No vein abnormality is noted. Review of the MIP images confirms the above findings. NON-VASCULAR Hepatobiliary: Gallbladder is well distended. Fatty infiltration of the liver  is seen. Pancreas: Unremarkable. No pancreatic ductal dilatation or surrounding inflammatory changes. Spleen: Normal in size without focal abnormality. Adrenals/Urinary Tract: Adrenal glands are within normal limits bilaterally. No renal calculi or urinary tract obstructive changes are seen. No ureteral stones are noted. The bladder is decompressed. Stomach/Bowel: Scattered diverticular change of the colon is noted without evidence of diverticulitis. The appendix is within normal limits. No small bowel abnormality is seen. Stomach is unremarkable. Lymphatic: No significant lymphadenopathy is noted. Reproductive: Status post hysterectomy. No adnexal masses. Other: No abdominal wall hernia or abnormality. No abdominopelvic ascites. Musculoskeletal: Degenerative changes of lumbar spine are seen. No compression deformity is noted. Review of the MIP images confirms the above findings. IMPRESSION: Atherosclerotic plaque is noted within the thoracic and abdominal aorta without evidence of aneurysmal dilatation or focal dissection. No evidence of pulmonary emboli. Minimal left basilar atelectasis. Diverticulosis without diverticulitis.  No acute abnormality noted. Electronically Signed   By: Inez Catalina M.D.   On: September 04, 2018 11:50    Procedures Procedures (including critical care time)  CRITICAL CARE Performed by: Fredia Sorrow Total critical care time: 30  minutes Critical care time was exclusive of separately billable procedures and treating other patients. Critical care was necessary to treat or prevent imminent or life-threatening deterioration. Critical care was time spent personally by me on the following activities: development of treatment plan with patient and/or surrogate as well as nursing, discussions with consultants, evaluation of patient's response to treatment, examination of patient, obtaining history from patient or surrogate, ordering and performing treatments and interventions, ordering and review of laboratory studies, ordering and review of radiographic studies, pulse oximetry and re-evaluation of patient's condition.   Medications Ordered in ED Medications  0.9 %  sodium chloride infusion ( Intravenous New Bag/Given 09-04-2018 1010)  nitroGLYCERIN (NITROSTAT) SL tablet 0.4 mg (0.4 mg Sublingual Given Sep 04, 2018 1050)  sodium chloride (PF) 0.9 % injection (has no administration in time range)  iopamidol (ISOVUE-370) 76 % injection (has no administration in time range)  heparin bolus via infusion 4,000 Units (has no administration in time range)  heparin ADULT infusion 100 units/mL (25000 units/253mL sodium chloride 0.45%) (has no administration in time range)  ondansetron (ZOFRAN) injection 4 mg (4 mg Intravenous Given 09/04/18 1013)  aspirin chewable tablet 324 mg (324 mg Oral Given 09/04/18 1050)  iopamidol (ISOVUE-370) 76 % injection 100 mL (100 mLs Intravenous Contrast Given September 04, 2018 1100)     Initial Impression / Assessment and Plan / ED Course  I have reviewed the triage vital signs and the nursing notes.  Pertinent labs & imaging results that were available during my care of the patient were reviewed by me and considered in my medical decision making (see chart for details).    Patient's initial concern was for intra-abdominal process due to the epigastric pain when to get her history on Friday she had fairly significant  pain felt like a heavy weight that was epigastric and lower part of her anterior chest.  Also has some discomfort between her shoulder blades.  Pain is persisted mostly in the epigastric area since then.  Patient's had nausea no real shortness of breath.  Had a white count yesterday of 19,000 was seen in urgent care.  Based on patient's elevated troponin of 8 EKG without any acute changes is highly suspicious patient may have had a non-STEMI.  No evidence of any significant Q waves on EKG today.  Patient due to her symptoms we will also get CT angios of chest abdomen  and pelvis to rule out dissection.  If that is negative and does not show any acute intra-abdominal process patient may very well require cardiology admission for non-STEMI.  Patient will be given aspirin and nitroglycerin.  Will hold on heparin until CT angios are done.  Patient did not request any pain medicine.  CT angios chest and abdomen and pelvis without evidence of dissection or pulmonary embolism.  No acute abdominal abnormalities there was some diverticulosis but no diverticulitis.  Feel that patient probably had myocardial infarction on Friday.  Second troponin is ordered for 1300.  Will discuss with cardiology.  We will start heparin.  Patient did receive aspirin.  Is now pain-free.  Cardiology will see the patient.   Final Clinical Impressions(s) / ED Diagnoses   Final diagnoses:  Non-STEMI (non-ST elevated myocardial infarction) [facility])    ED Discharge Orders    None       Fredia Sorrow, MD 09/17/18 [id number]    Fredia Sorrow, MD Sep 17, 2018 1206    Fredia Sorrow, MD Sep 17, 2018 1238    Fredia Sorrow, MD 2018/09/17 1239

## 2018-09-18 NOTE — ED Notes (Signed)
Cath lab called; [facility] will be on their way shortly

## 2018-09-18 NOTE — Discharge Summary (Signed)
Discharge Summary    Patient ID: Bonnie Lawson MRN: [id number]; DOB: [birth year]  Admit date: 2018-09-03 Discharge date: 09/08/2018  Primary Care Provider: Forrest Moron, MD  Primary Cardiologist: No primary care provider on file. Primary Electrophysiologist:  None   Discharge Diagnoses    Cause of death suspected to be pericardial tamponade due to myocardial rupture  Principal Problem:   NSTEMI (non-ST elevated myocardial infarction) [facility]) Active Problems:   Abdominal pain, chronic, epigastric   Essential hypertension   Cigarette smoker   Allergies Allergies  Allergen Reactions  . Sulfa Antibiotics Other (See Comments)    Unknown rxn    Diagnostic Studies/Procedures    Cardiac catheterization and coronary angiography Sep 03, 2018, Dr. Daneen Schick _____________   History of Present Illness     The patient presented on the morning of January 16 with complaints of chest pain that have been going on for roughly 6 days.  Cardiac enzymes were abnormal suggesting myocardial infarction.  The electrocardiogram did not show repolarization abnormalities, but did show a rather abrupt increase in R wave voltage in leads V2 suggesting completed posterior infarction.   Hospital Course      Cardiac catheterization was performed on the day of the admission and showed total occlusion of an oblique marginal branch of the left circumflex coronary artery without any collateral filling of the distal vessel.  There was otherwise only mild-moderate evidence of atherosclerotic disease.  Overall left ventricular ejection fraction was only minimally decreased at 50%, with inferoapical/apical hypokinesis.  The left ventricular end-diastolic pressure was normal.  Due to the delayed presentation consistent with a completed posterior infarction, the decision was made to proceed with medical therapy.  The patient received dual antiplatelet therapy, beta-blockers and high-dose statin.  The  cardiac catheterization procedure was completed at approximately 1730 hrs. hemostasis was achieved without incident.  An echocardiogram was ordered but have not yet been completed.  Around 2030 hrs. the patient abruptly complained of upper chest pain with a pleuritic quality.  Within a matter of minutes she became unresponsive with agonal breathing and severe bradycardia.  Cardiopulmonary resuscitation was initiated.  She was found to have pulseless electrical activity.  The electrocardiogram showed severe sinus bradycardia with marked diffuse ST segment elevation consistent with acute pericarditis/diffuse injury.  Despite multiple rounds of medications and continued CPR the patient expired and was pronounced dead at 12-Nov-2054 hrs.  The constellation of abrupt onset of pleuritic chest pain, bradycardia, pulseless electrical activity and poor response to resuscitation measures in a patient with delayed presentation of acute myocardial infarction with closed artery is strongly suggestive of myocardial rupture leading to pericardial tamponade. _____________  Discharge Vitals Blood pressure 105/62, pulse (!) 123, temperature 98 F (36.7 C), temperature source Axillary, resp. rate (!) 43, height 5\' 3"  (1.6 m), weight 83.3 kg, SpO2 100 %.  Filed Weights   03-Sep-2018 2098/11/11  Weight: 83.3 kg    Labs & Radiologic Studies   _____________  Dg Chest 2 View  Result Date: 09-03-2018 CLINICAL DATA:  Shortness of breath and epigastric pain. Cough and congestion. EXAM: CHEST - 2 VIEW COMPARISON:  08/24/2018 FINDINGS: Heart size is normal. Aortic atherosclerosis. Bronchitis pattern without consolidation or lobar collapse. Possible mild atelectasis in the lingula. No effusion. No significant bone finding. IMPRESSION: 1. Bronchitis pattern. Possible mild atelectasis in the lingula. No consolidation or lobar collapse. 2. Aortic atherosclerosis. Electronically Signed   By: Nelson Chimes M.D.   On: 2018/09/03 10:31   Dg Chest 2  View  Result Date: 08/24/2018 CLINICAL DATA:  Cough EXAM: CHEST - 2 VIEW COMPARISON:  05/18/2017 FINDINGS: Cardiac shadows within normal limits. Aortic calcifications are again seen. The lungs are well aerated bilaterally. No focal infiltrate or sizable effusion is seen. Degenerative changes of the thoracic spine are noted. IMPRESSION: No acute abnormality noted. Electronically Signed   By: Inez Catalina M.D.   On: 08/24/2018 12:06   Ct Angio Chest/abd/pel For Dissection W And/or W/wo  Result Date: Sep 11, 2018 CLINICAL DATA:  Chest and abdominal pain EXAM: CT ANGIOGRAPHY CHEST, ABDOMEN AND PELVIS TECHNIQUE: Multidetector CT imaging through the chest, abdomen and pelvis was performed using the standard protocol during bolus administration of intravenous contrast. Multiplanar reconstructed images and MIPs were obtained and reviewed to evaluate the vascular anatomy. CONTRAST:  100 mL Isovue 370. COMPARISON:  04/23/2016 CT of the abdomen and pelvis FINDINGS: CTA CHEST FINDINGS Cardiovascular: Atherosclerotic calcifications of the thoracic aorta are noted. Mild noncalcified atherosclerotic plaque is noted in the descending thoracic aorta. No aneurysmal dilatation is seen. No evidence of dissection is seen. No cardiac enlargement is noted. Mild coronary calcifications are identified. The pulmonary artery shows a normal branching pattern without evidence of pulmonary emboli. Mediastinum/Nodes: Thoracic inlet is within normal limits. No hilar or mediastinal adenopathy is noted. The esophagus is within normal limits. Lungs/Pleura: Lungs are well aerated bilaterally. Minimal left basilar atelectasis is seen in the lower lobe. Musculoskeletal: Degenerative changes of the thoracic spine are seen. Review of the MIP images confirms the above findings. CTA ABDOMEN AND PELVIS FINDINGS VASCULAR Aorta: The abdominal aorta demonstrates some mildly ulcerated atherosclerotic plaque without aneurysmal dilatation or focal dissection.  Celiac: Patent without evidence of aneurysm, dissection, vasculitis or significant stenosis. SMA: Patent without evidence of aneurysm, dissection, vasculitis or significant stenosis. Renals: Dual renal arteries are identified bilaterally. Mild atherosclerotic changes are seen. No focal hemodynamically significant stenosis is seen. IMA: Patent without evidence of aneurysm, dissection, vasculitis or significant stenosis. Iliacs: Atherosclerotic changes without acute abnormality. Veins: No vein abnormality is noted. Review of the MIP images confirms the above findings. NON-VASCULAR Hepatobiliary: Gallbladder is well distended. Fatty infiltration of the liver is seen. Pancreas: Unremarkable. No pancreatic ductal dilatation or surrounding inflammatory changes. Spleen: Normal in size without focal abnormality. Adrenals/Urinary Tract: Adrenal glands are within normal limits bilaterally. No renal calculi or urinary tract obstructive changes are seen. No ureteral stones are noted. The bladder is decompressed. Stomach/Bowel: Scattered diverticular change of the colon is noted without evidence of diverticulitis. The appendix is within normal limits. No small bowel abnormality is seen. Stomach is unremarkable. Lymphatic: No significant lymphadenopathy is noted. Reproductive: Status post hysterectomy. No adnexal masses. Other: No abdominal wall hernia or abnormality. No abdominopelvic ascites. Musculoskeletal: Degenerative changes of lumbar spine are seen. No compression deformity is noted. Review of the MIP images confirms the above findings. IMPRESSION: Atherosclerotic plaque is noted within the thoracic and abdominal aorta without evidence of aneurysmal dilatation or focal dissection. No evidence of pulmonary emboli. Minimal left basilar atelectasis. Diverticulosis without diverticulitis.  No acute abnormality noted. Electronically Signed   By: Inez Catalina M.D.   On: 09/11/18 11:50   Disposition   Patient  deceased.   Discharge Medications   Allergies as of 09-11-18      Reactions   Sulfa Antibiotics Other (See Comments)   Unknown rxn      Medication List    ASK your doctor about these medications   aspirin EC 81 MG tablet Take 81 mg by mouth  daily.   benzonatate 200 MG capsule Commonly known as:  TESSALON Take 1 capsule (200 mg total) by mouth 2 (two) times daily as needed for cough.   celecoxib 200 MG capsule Commonly known as:  CELEBREX Take 200 mg by mouth 2 (two) times daily.   cyclobenzaprine 10 MG tablet Commonly known as:  FLEXERIL Take 1 tablet (10 mg total) by mouth 3 (three) times daily as needed for muscle spasms.   fluticasone 50 MCG/ACT nasal spray Commonly known as:  FLONASE Place 2 sprays into both nostrils daily.   HYDROcodone-acetaminophen 5-325 MG tablet Commonly known as:  NORCO/VICODIN Take 1 tablet by mouth every 6 (six) hours as needed for moderate pain.   levothyroxine 88 MCG tablet Commonly known as:  SYNTHROID, LEVOTHROID TAKE 1 TABLET BY MOUTH DAILY BEFORE BREAKFAST WITH NO OTHER MEDS   losartan-hydrochlorothiazide 100-25 MG tablet Commonly known as:  HYZAAR Take 1 tablet by mouth daily.   omeprazole 40 MG capsule Commonly known as:  PRILOSEC Take 1 capsule (40 mg total) by mouth daily.   pregabalin 300 MG capsule Commonly known as:  LYRICA Take 1 capsule (300 mg total) by mouth 2 (two) times daily.   venlafaxine XR 150 MG 24 hr capsule Commonly known as:  EFFEXOR-XR TAKE 1 CAPSULE(150 MG) BY MOUTH TWICE DAILY WITH A MEAL   VITAMIN B-12 PO Take 1 tablet by mouth daily.   VITAMIN C PO Take 1 tablet by mouth daily.   VITAMIN D PO Take 1 tablet by mouth daily.   VITAMIN E PO Take 1 tablet by mouth daily.        Acute coronary syndrome (MI, NSTEMI, STEMI, etc) this admission?: Yes.   Delayed presentation (6 days after onset)  AHA/ACC Clinical Performance & Quality Measures: n/a   Signed, Sanda Klein,  MD 09/08/2018, 5:40 PM

## 2018-09-18 NOTE — Interval H&P Note (Signed)
Cath Lab Visit (complete for each Cath Lab visit)  Clinical Evaluation Leading to the Procedure:   ACS: Yes.    Non-ACS:    Anginal Classification: CCS III  Anti-ischemic medical therapy: Maximal Therapy (2 or more classes of medications)  Non-Invasive Test Results: No non-invasive testing performed  Prior CABG: No previous CABG      History and Physical Interval Note:  09/08/18 4:54 PM  Bonnie Lawson  has presented today for surgery, with the diagnosis of trop 8  The various methods of treatment have been discussed with the patient and family. After consideration of risks, benefits and other options for treatment, the patient has consented to  Procedure(s): LEFT HEART CATH AND CORONARY ANGIOGRAPHY (N/A) as a surgical intervention .  The patient's history has been reviewed, patient examined, no change in status, stable for surgery.  I have reviewed the patient's chart and labs.  Questions were answered to the patient's satisfaction.     Belva Crome III

## 2018-09-18 NOTE — Progress Notes (Addendum)
Called by the daughter around 2013 to go to pt's  room because patient complaining of upper chest pain.Went to patient's room  with the daughter & found pt unresponsive ,on agonal breathing ,bradycardic with HR in 30's,& with weak carotid pulse. Code blue called .Code team arrived .Pt went [facility] & CPR started .(please see CPR record for meds.given & sequence of events).[email or web address] .Resuscitation event ended  & death  pronounced by Dr.Chakravartti at 2056.[place]  [employer] called ,bed placement informed.Chaplain at family's bedside giving emotional & spiritual support.Informed pt's daughter  to call bed placement for  Funeral arrangements.Post mortem care done.

## 2018-09-18 NOTE — ED Notes (Signed)
Pt provided with ham sandwich with cardiologist approval.

## 2018-09-18 DEATH — deceased

## 2018-10-08 ENCOUNTER — Ambulatory Visit: Payer: Medicare HMO

## 2018-10-08 ENCOUNTER — Other Ambulatory Visit: Payer: Medicare HMO

## 2018-11-05 ENCOUNTER — Ambulatory Visit: Payer: Medicare HMO | Admitting: Family Medicine

## 2019-06-29 IMAGING — DX DG CHEST 2V
2 series · 2 of 2 positions shown · non-contrast
Comparison: 05/18/2017

CLINICAL DATA: Cough

EXAM:
CHEST - 2 VIEW

[chest pa]
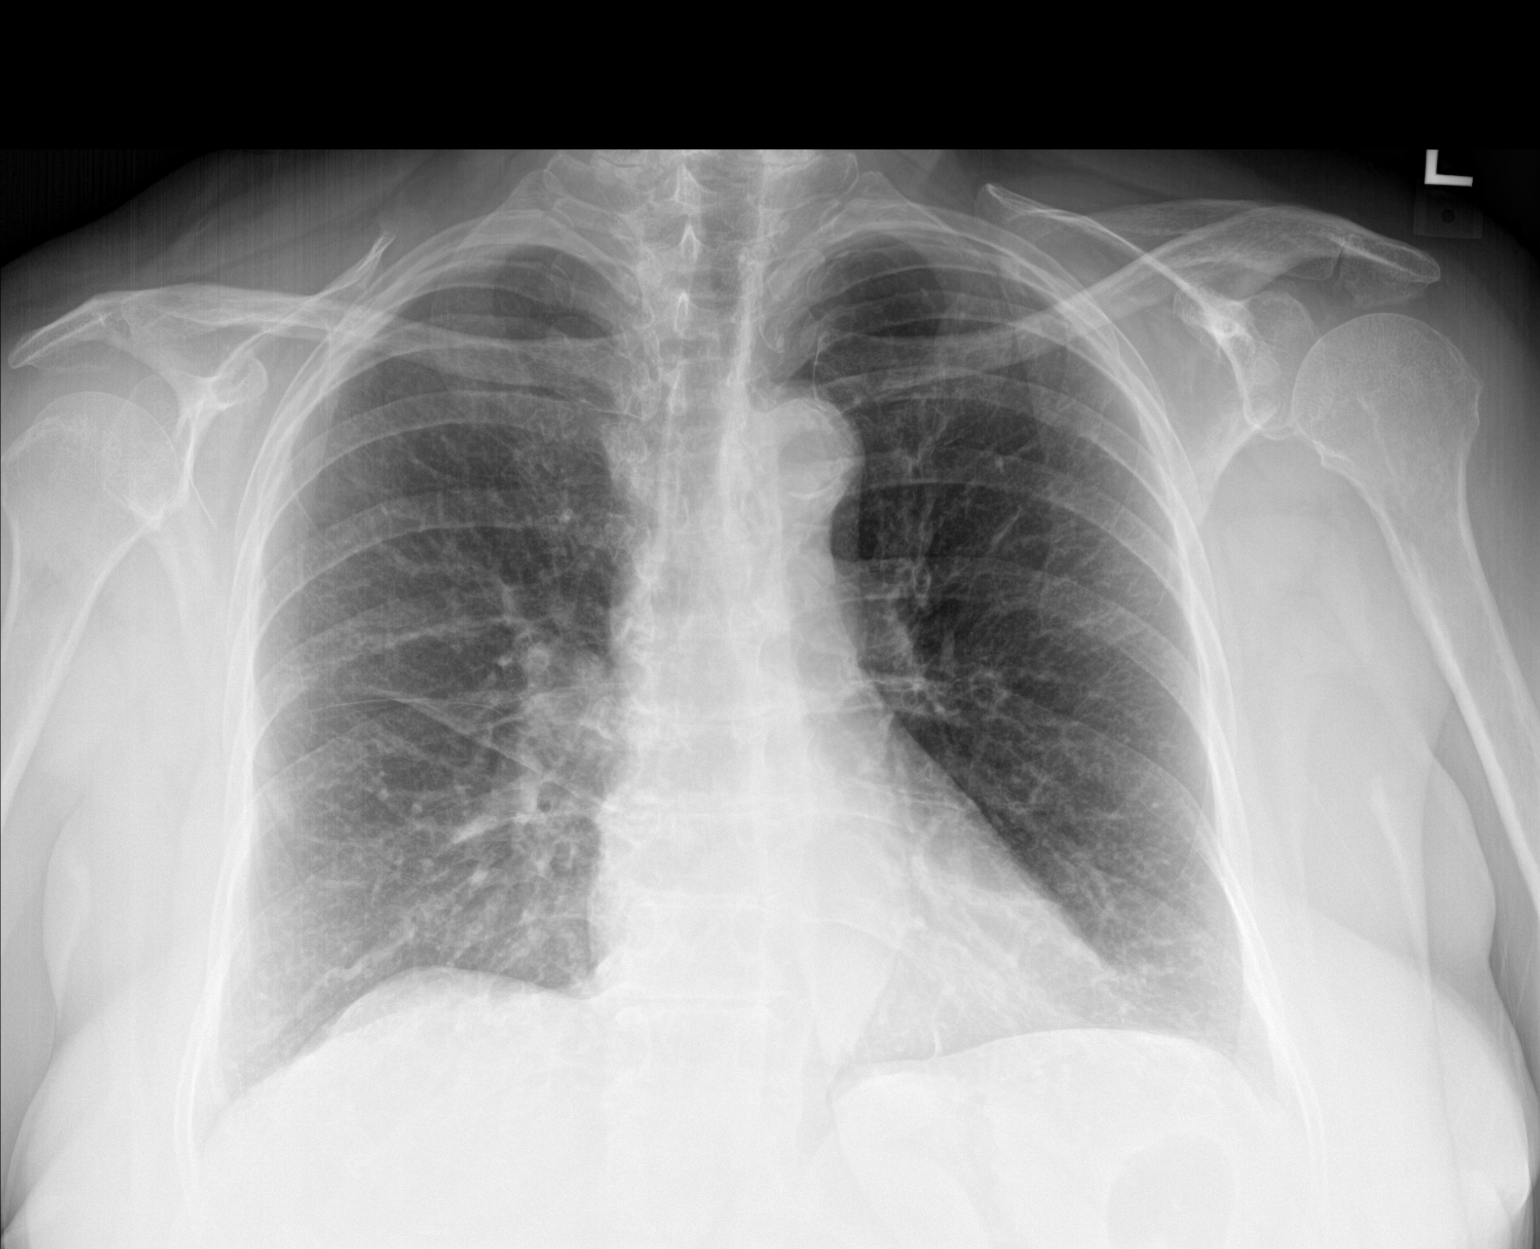

[chest lat]
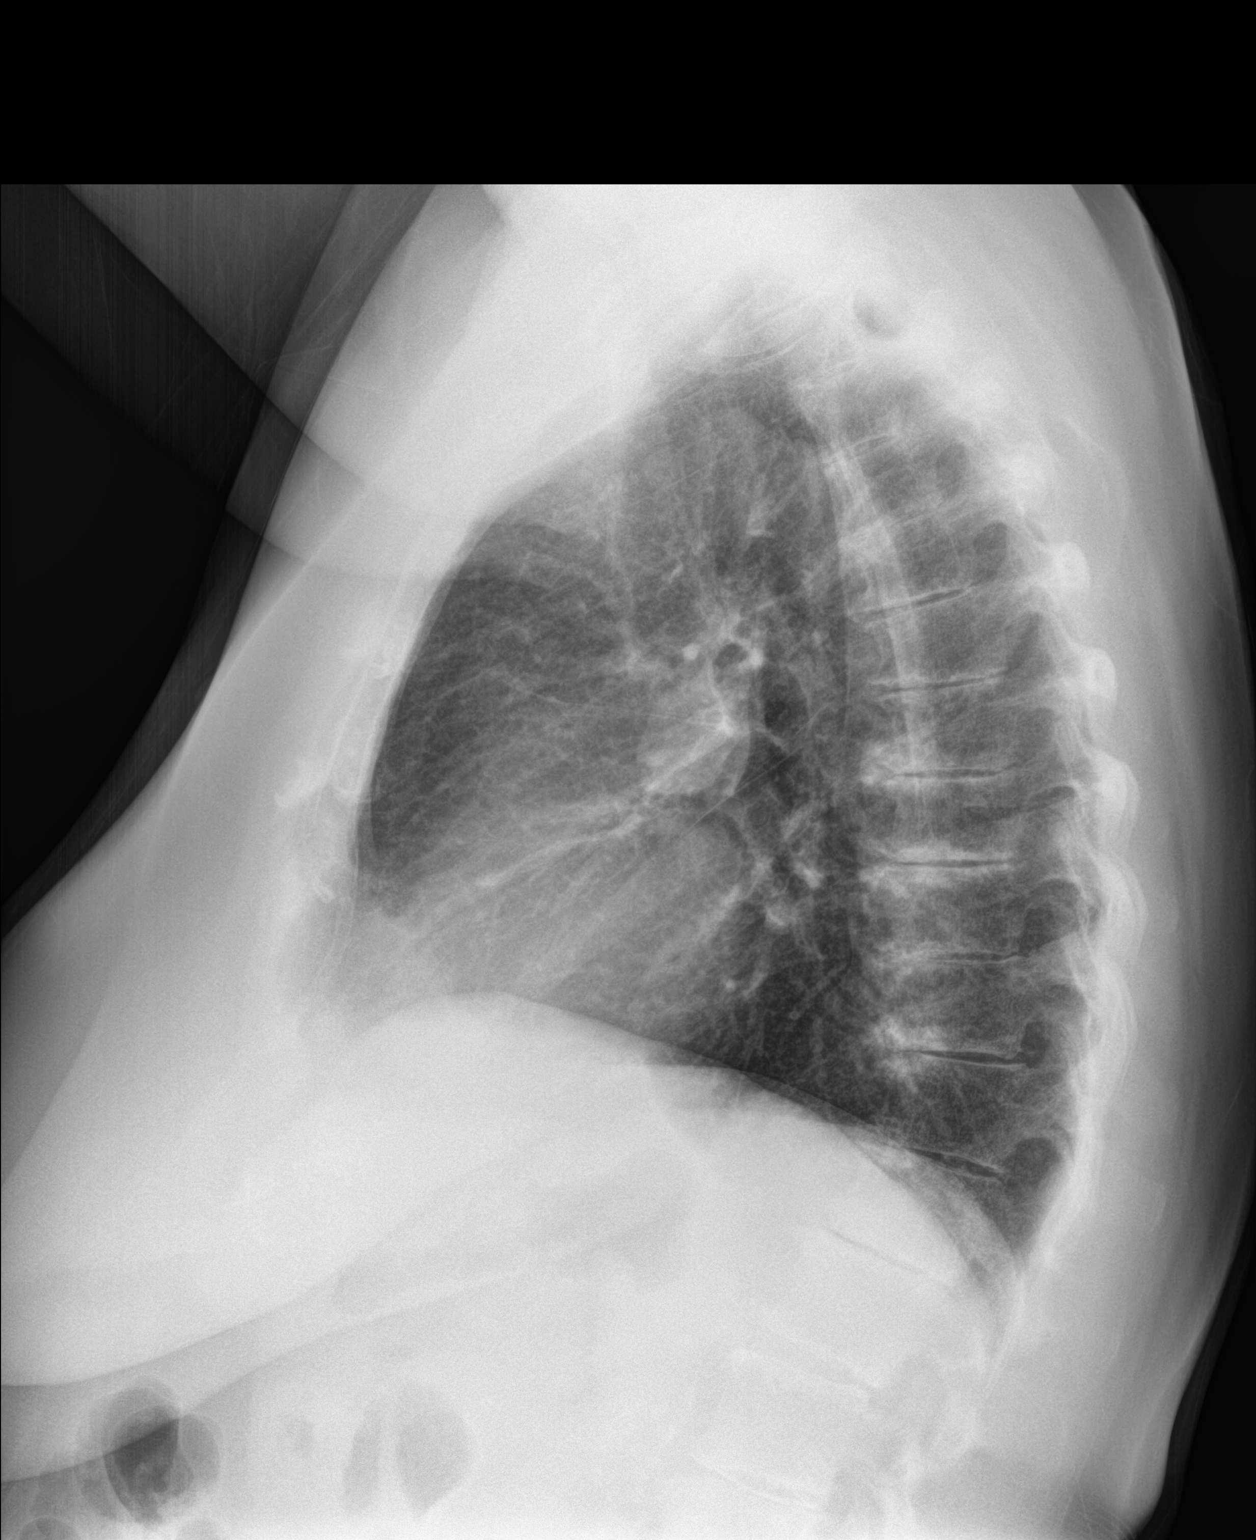

[2 of 2 positions shown; findings below may reference images not displayed]

FINDINGS: Cardiac shadows within normal limits. Aortic calcifications are
again seen. The lungs are well aerated bilaterally. No focal
infiltrate or sizable effusion is seen. Degenerative changes of the
thoracic spine are noted.
IMPRESSION: No acute abnormality noted.
